# Patient Record
Sex: Male | Born: 1960 | Race: White | Hispanic: No | Marital: Married | Smoking: Current every day smoker
Health system: Southern US, Community
[De-identification: ages and names within clinical notes are randomized; demographics above are authoritative.]

## PROBLEM LIST (undated history)

## (undated) DIAGNOSIS — Z22322 Carrier or suspected carrier of Methicillin resistant Staphylococcus aureus: Secondary | ICD-10-CM

## (undated) DIAGNOSIS — E119 Type 2 diabetes mellitus without complications: Secondary | ICD-10-CM

## (undated) DIAGNOSIS — G629 Polyneuropathy, unspecified: Secondary | ICD-10-CM

## (undated) DIAGNOSIS — Z86711 Personal history of pulmonary embolism: Secondary | ICD-10-CM

## (undated) DIAGNOSIS — Z973 Presence of spectacles and contact lenses: Secondary | ICD-10-CM

## (undated) DIAGNOSIS — G4730 Sleep apnea, unspecified: Secondary | ICD-10-CM

## (undated) DIAGNOSIS — N433 Hydrocele, unspecified: Secondary | ICD-10-CM

## (undated) DIAGNOSIS — J410 Simple chronic bronchitis: Secondary | ICD-10-CM

## (undated) DIAGNOSIS — I10 Essential (primary) hypertension: Secondary | ICD-10-CM

## (undated) DIAGNOSIS — J439 Emphysema, unspecified: Secondary | ICD-10-CM

## (undated) DIAGNOSIS — R911 Solitary pulmonary nodule: Secondary | ICD-10-CM

## (undated) DIAGNOSIS — Z7901 Long term (current) use of anticoagulants: Secondary | ICD-10-CM

## (undated) DIAGNOSIS — Z8614 Personal history of Methicillin resistant Staphylococcus aureus infection: Secondary | ICD-10-CM

## (undated) DIAGNOSIS — J449 Chronic obstructive pulmonary disease, unspecified: Secondary | ICD-10-CM

## (undated) DIAGNOSIS — G4734 Idiopathic sleep related nonobstructive alveolar hypoventilation: Secondary | ICD-10-CM

## (undated) DIAGNOSIS — I2699 Other pulmonary embolism without acute cor pulmonale: Secondary | ICD-10-CM

## (undated) DIAGNOSIS — Z86718 Personal history of other venous thrombosis and embolism: Secondary | ICD-10-CM

## (undated) DIAGNOSIS — K219 Gastro-esophageal reflux disease without esophagitis: Secondary | ICD-10-CM

## (undated) DIAGNOSIS — R351 Nocturia: Secondary | ICD-10-CM

## (undated) DIAGNOSIS — D689 Coagulation defect, unspecified: Secondary | ICD-10-CM

## (undated) HISTORY — DX: Coagulation defect, unspecified: D68.9

## (undated) HISTORY — DX: Emphysema, unspecified: J43.9

## (undated) HISTORY — DX: Carrier or suspected carrier of methicillin resistant Staphylococcus aureus: Z22.322

## (undated) HISTORY — PX: INCISION AND DRAINAGE ABSCESS: SHX5864

## (undated) HISTORY — DX: Sleep apnea, unspecified: G47.30

## (undated) HISTORY — DX: Essential (primary) hypertension: I10

## (undated) HISTORY — DX: Chronic obstructive pulmonary disease, unspecified: J44.9

## (undated) HISTORY — DX: Gastro-esophageal reflux disease without esophagitis: K21.9

## (undated) HISTORY — DX: Other pulmonary embolism without acute cor pulmonale: I26.99

## (undated) HISTORY — DX: Solitary pulmonary nodule: R91.1

## (undated) HISTORY — DX: Type 2 diabetes mellitus without complications: E11.9

## (undated) HISTORY — DX: Polyneuropathy, unspecified: G62.9

---

## 2003-06-15 ENCOUNTER — Emergency Department (HOSPITAL_COMMUNITY): Admission: EM | Admit: 2003-06-15 | Discharge: 2003-06-15 | Payer: Self-pay | Admitting: *Deleted

## 2003-06-15 ENCOUNTER — Encounter: Payer: Self-pay | Admitting: *Deleted

## 2005-11-30 ENCOUNTER — Ambulatory Visit: Payer: Self-pay | Admitting: Internal Medicine

## 2008-06-25 DIAGNOSIS — J111 Influenza due to unidentified influenza virus with other respiratory manifestations: Secondary | ICD-10-CM | POA: Insufficient documentation

## 2008-06-28 ENCOUNTER — Ambulatory Visit: Payer: Self-pay | Admitting: Family Medicine

## 2008-06-28 DIAGNOSIS — J45909 Unspecified asthma, uncomplicated: Secondary | ICD-10-CM | POA: Insufficient documentation

## 2008-09-09 ENCOUNTER — Ambulatory Visit: Payer: Self-pay | Admitting: Family Medicine

## 2009-08-24 ENCOUNTER — Inpatient Hospital Stay (HOSPITAL_COMMUNITY): Admission: EM | Admit: 2009-08-24 | Discharge: 2009-08-25 | Payer: Self-pay | Admitting: Emergency Medicine

## 2009-08-25 ENCOUNTER — Telehealth: Payer: Self-pay | Admitting: Family Medicine

## 2009-08-29 ENCOUNTER — Ambulatory Visit: Payer: Self-pay | Admitting: Internal Medicine

## 2009-08-29 DIAGNOSIS — Z86711 Personal history of pulmonary embolism: Secondary | ICD-10-CM | POA: Insufficient documentation

## 2009-08-29 DIAGNOSIS — I2699 Other pulmonary embolism without acute cor pulmonale: Secondary | ICD-10-CM

## 2009-08-29 DIAGNOSIS — I82409 Acute embolism and thrombosis of unspecified deep veins of unspecified lower extremity: Secondary | ICD-10-CM | POA: Insufficient documentation

## 2009-08-29 LAB — CONVERTED CEMR LAB
INR: 1.7
Prothrombin Time: 16.1 s

## 2009-09-01 ENCOUNTER — Ambulatory Visit: Payer: Self-pay | Admitting: Internal Medicine

## 2009-09-01 LAB — CONVERTED CEMR LAB
INR: 2.6
Prothrombin Time: 19.6 s

## 2009-09-02 ENCOUNTER — Encounter: Payer: Self-pay | Admitting: Internal Medicine

## 2009-09-05 ENCOUNTER — Telehealth: Payer: Self-pay | Admitting: Internal Medicine

## 2009-09-05 ENCOUNTER — Ambulatory Visit: Payer: Self-pay | Admitting: Internal Medicine

## 2009-09-05 LAB — CONVERTED CEMR LAB
INR: 2.4
Prothrombin Time: 18.7 s

## 2009-09-09 ENCOUNTER — Ambulatory Visit: Payer: Self-pay | Admitting: Internal Medicine

## 2009-09-09 DIAGNOSIS — J984 Other disorders of lung: Secondary | ICD-10-CM | POA: Insufficient documentation

## 2009-09-09 DIAGNOSIS — R911 Solitary pulmonary nodule: Secondary | ICD-10-CM | POA: Insufficient documentation

## 2009-09-09 LAB — CONVERTED CEMR LAB
INR: 2.1
Prothrombin Time: 17.8 s

## 2009-09-23 ENCOUNTER — Ambulatory Visit: Payer: Self-pay | Admitting: Internal Medicine

## 2009-09-23 LAB — CONVERTED CEMR LAB
INR: 1.6
Prothrombin Time: 15.6 s

## 2009-10-06 ENCOUNTER — Ambulatory Visit: Payer: Self-pay | Admitting: Internal Medicine

## 2009-10-06 LAB — CONVERTED CEMR LAB
INR: 1.6
Prothrombin Time: 15.5 s

## 2009-10-27 ENCOUNTER — Ambulatory Visit: Payer: Self-pay | Admitting: Internal Medicine

## 2009-10-27 LAB — CONVERTED CEMR LAB
INR: 1.9
Prothrombin Time: 17 s

## 2009-11-24 ENCOUNTER — Ambulatory Visit: Payer: Self-pay | Admitting: Internal Medicine

## 2009-11-24 LAB — CONVERTED CEMR LAB
INR: 1.2
Prothrombin Time: 13.5 s

## 2009-12-08 ENCOUNTER — Ambulatory Visit: Payer: Self-pay | Admitting: Internal Medicine

## 2009-12-08 LAB — CONVERTED CEMR LAB
INR: 1.5
Prothrombin Time: 15.2 s

## 2009-12-22 ENCOUNTER — Ambulatory Visit: Payer: Self-pay | Admitting: Internal Medicine

## 2009-12-22 LAB — CONVERTED CEMR LAB
INR: 1.9
Prothrombin Time: 17.1 s

## 2010-01-05 ENCOUNTER — Ambulatory Visit: Payer: Self-pay | Admitting: Internal Medicine

## 2010-01-05 LAB — CONVERTED CEMR LAB
INR: 3.1
Prothrombin Time: 21.3 s

## 2010-02-21 ENCOUNTER — Ambulatory Visit: Payer: Self-pay | Admitting: Internal Medicine

## 2010-02-21 DIAGNOSIS — G4700 Insomnia, unspecified: Secondary | ICD-10-CM | POA: Insufficient documentation

## 2010-04-10 ENCOUNTER — Telehealth: Payer: Self-pay | Admitting: Internal Medicine

## 2010-10-03 NOTE — Assessment & Plan Note (Signed)
Summary: PT //RS  Nurse Visit   Allergies: No Known Drug Allergies Laboratory Results   Blood Tests   Date/Time Received: December 08, 2009 3:59 PM  Date/Time Reported: December 08, 2009 3:59 PM   PT: 15.2 s   (Normal Range: 10.6-13.4)  INR: 1.5   (Normal Range: 0.88-1.12   Therap INR: 2.0-3.5) Comments: Wynona Canes, CMA  December 08, 2009 3:59 PM     Orders Added: 1)  Est. Patient Level I [99211] 2)  Protime [25053ZJ] Prescriptions: WARFARIN SODIUM 5 MG TABS (WARFARIN SODIUM) two daily  #90 x 0   Entered by:   Sid Falcon LPN   Authorized by:   Evelena Peat MD   Signed by:   Sid Falcon LPN on 67/34/1937   Method used:   [facility]  [address] [phone]       [address]       [place], [place]  [address]       Ph: [phone]       Fax: [phone]   RxID:   [id number]   Laboratory Results   Blood Tests     PT: 15.2 s   (Normal Range: 10.6-13.4)  INR: 1.5   (Normal Range: 0.88-1.12   Therap INR: 2.0-3.5) Comments: Wynona Canes, CMA  December 08, 2009 3:59 PM       ANTICOAGULATION RECORD PREVIOUS REGIMEN & LAB RESULTS Anticoagulation Diagnosis:  v58.61,v58.83,453.41,415.19 on  10/27/2009 Previous INR Goal Range:  2.0-3.0 on  10/27/2009 Previous INR:  1.2 on  11/24/2009 Previous Coumadin Dose(mg):  12.5 mg tues & thurs, 10 mg others on  11/24/2009 Previous Regimen:  12.5 mg M, W, F, 10 mg others on  11/24/2009  NEW REGIMEN & LAB RESULTS Current INR: 1.5 Current Coumadin Dose(mg): 12.5mg  M,W,F 10mg  others Regimen: 12.5mg  on m,w.f,sat then 10mg  other days       Repeat testing in: 2 weeks MEDICATIONS WARFARIN SODIUM 5 MG TABS (WARFARIN SODIUM) two daily HYDROCODONE-ACETAMINOPHEN 5-500 MG TABS (HYDROCODONE-ACETAMINOPHEN) one every 6 hours as needed for pain   Anticoagulation Visit Questionnaire      Coumadin dose missed/changed:  No      Abnormal Bleeding Symptoms:  No   Any diet  changes including alcohol intake, vegetables or greens since the last visit:  No Any illnesses or hospitalizations since the last visit:  No Any signs of clotting since the last visit (including chest discomfort, dizziness, shortness of breath, arm tingling, slurred speech, swelling or redness in leg):  No

## 2010-10-03 NOTE — Letter (Signed)
Summary: Generic Letter  [place] [facility]  [address] [place], [place] [address]   Phone: [phone]  Fax: [phone]    09/09/2009  Dustin Koch [address] [place], [place]  [address]  Dear Milford Cage:  [name] is a patient followed in our internal medicine practice and has been hospitalized recently for thromboembolic disease.  He remains on chronic anticoagulation therapy;  it is recommended that he avoid strenuous activity,  heavy lifting and prolonged walking on hard surfaces.  Frequent periods of rest with elevation of his legs is strongly encouraged.  This period of  restricted activity is anticipated to last approximately 4 weeks.          Sincerely,   Eleonore Chiquito  MD

## 2010-10-03 NOTE — Assessment & Plan Note (Signed)
Summary: PT//LH  Nurse Visit   Allergies: No Known Drug Allergies Laboratory Results   Blood Tests     PT: 15.5 s   (Normal Range: 10.6-13.4)  INR: 1.6   (Normal Range: 0.88-1.12   Therap INR: 2.0-3.5) Comments: Rita Ohara  October 06, 2009 3:55 PM     Orders Added: 1)  Est. Patient Level I [99211] 2)  Protime [[id number]]   ANTICOAGULATION RECORD PREVIOUS REGIMEN & LAB RESULTS   Previous INR:  1.6 on  09/23/2009  Previous Regimen:  sme on  09/23/2009  NEW REGIMEN & LAB RESULTS Current INR: 1.6 Regimen: 12.5mg . M, TH all others 10mg .  Repeat testing in: 3 weeks  Anticoagulation Visit Questionnaire Coumadin dose missed/changed:  No Abnormal Bleeding Symptoms:  No  Any diet changes including alcohol intake, vegetables or greens since the last visit:  No Any illnesses or hospitalizations since the last visit:  No Any signs of clotting since the last visit (including chest discomfort, dizziness, shortness of breath, arm tingling, slurred speech, swelling or redness in leg):  No  MEDICATIONS WARFARIN SODIUM 5 MG TABS (WARFARIN SODIUM) two daily HYDROCODONE-ACETAMINOPHEN 5-500 MG TABS (HYDROCODONE-ACETAMINOPHEN) one every 6 hours as needed for pain

## 2010-10-03 NOTE — Assessment & Plan Note (Signed)
Summary: PT//CCM  Nurse Visit   Allergies: No Known Drug Allergies Laboratory Results   Blood Tests   Date/Time Received: November 24, 2009 3:31 PM Date/Time Reported: November 24, 2009 3:31 PM  PT: 13.5 s   (Normal Range: 10.6-13.4)  INR: 1.2   (Normal Range: 0.88-1.12   Therap INR: 2.0-3.5) Comments: Wynona Canes, CMA  November 24, 2009 3:32 PM    Orders Added: 1)  Est. Patient Level I [99211] 2)  Protime [[id number]]  Laboratory Results   Blood Tests     PT: 13.5 s   (Normal Range: 10.6-13.4)  INR: 1.2   (Normal Range: 0.88-1.12   Therap INR: 2.0-3.5) Comments: Wynona Canes, CMA  November 24, 2009 3:32 PM      ANTICOAGULATION RECORD PREVIOUS REGIMEN & LAB RESULTS Anticoagulation Diagnosis:  v58.61,v58.83,453.41,415.19 on  10/27/2009 Previous INR Goal Range:  2.0-3.0 on  10/27/2009 Previous INR:  1.9 on  10/27/2009 Previous Coumadin Dose(mg):  12.5mg  on mon,thu. 10mg  other days on  10/27/2009 Previous Regimen:  same on  10/27/2009  NEW REGIMEN & LAB RESULTS Current INR: 1.2 Current Coumadin Dose(mg): 12.5 mg tues & thurs, 10 mg others Regimen: 12.5 mg M, W, F, 10 mg others       Repeat testing in: 2 wks MEDICATIONS WARFARIN SODIUM 5 MG TABS (WARFARIN SODIUM) two daily HYDROCODONE-ACETAMINOPHEN 5-500 MG TABS (HYDROCODONE-ACETAMINOPHEN) one every 6 hours as needed for pain   Anticoagulation Visit Questionnaire      Coumadin dose missed/changed:  No      Abnormal Bleeding Symptoms:  No   Any diet changes including alcohol intake, vegetables or greens since the last visit:  No Any illnesses or hospitalizations since the last visit:  No Any signs of clotting since the last visit (including chest discomfort, dizziness, shortness of breath, arm tingling, slurred speech, swelling or redness in leg):  No

## 2010-10-03 NOTE — Progress Notes (Signed)
Summary: Pt req refill of Triazolam  Phone Note Call from Patient Call back at Home Phone [phone]   Caller: Patient Call For: Dustin Savers  MD Summary of Call: pt needs new rx triazolam [facility] [place] pt will cb with number Initial call taken by: Heron Sabins,  April 10, 2010 9:29 AM  Follow-up for Phone Call        Pt called back with number for [facility] [phone].   Follow-up by: Lucy Antigua,  April 10, 2010 9:41 AM  Additional Follow-up for Phone Call Additional follow up Details #1::        Pt called back again to check on status of this refill. Please have a doctor call this in today. Pt has now been out of this med for at least a week. Pt doesn't take this med every night, just prn.  Pt has now gone home to [place], [place] [facility] [place] [address] [phone]. Additional Follow-up by: Lucy Antigua,  April 14, 2010 2:59 PM    Prescriptions: TRIAZOLAM 0.25 MG TABS (TRIAZOLAM) one tablet at bedtime as needed  #30 x 0   Entered by:   Duard Brady LPN   Authorized by:   Dustin Savers  MD   Signed by:   Duard Brady LPN on 57/84/6962   Method used:   Historical   RxID:   [id number]  [facility] [place]. KIK

## 2010-10-03 NOTE — Assessment & Plan Note (Signed)
Summary: pt/ccm also ?bronchitis/njr   Vital Signs:  Patient profile:   50 year old male Weight:      231 pounds Temp:     98.0 degrees F oral BP sitting:   110 / 80  (right arm) Cuff size:   regular  Vitals Entered By: Duard Brady LPN (Jan 05, 453 3:56 PM) CC: c/o cough congestion fever  Is Patient Diabetic? No   CC:  c/o cough congestion fever .  Preventive Screening-Counseling & Management  Alcohol-Tobacco     Smoking Status: current     Smoking Cessation Counseling: yes  Allergies (verified): No Known Drug Allergies  Past History:  Past Medical History: Reviewed history from 09/09/2009 and no changes required. Asthma tobacco abuse acute pulmonary embolism 12- 2010 history of right middle lobe pulmonary nodule ( follow up chest CT recommended in June 2011)   Complete Medication List: 1)  Warfarin Sodium 5 Mg Tabs (Warfarin sodium) .... Two daily 2)  Hydrocodone-acetaminophen 5-500 Mg Tabs (Hydrocodone-acetaminophen) .... One every 6 hours as needed for pain 3)  Avelox 400 Mg Tabs (Moxifloxacin hcl) .... One daily 4)  Chantix Starting Month Pak 0.5 Mg X 11 & 1 Mg X 42 Tabs (Varenicline tartrate) .... As directed 5)  Chantix 1 Mg Tabs (Varenicline tartrate) .... One twice daily  Other Orders: Protime ([id number]) Fingerstick [phone])  Patient Instructions: 1)  Tobacco is very bad for your health and your loved ones! You Should stop smoking!. 2)  Get plenty of rest, drink lots of clear liquids, and use Tylenol  for fever and comfort. Return in 7-10 days if you're not better:sooner if you're feeling worse. 3)  Take your antibiotic as prescribed until ALL of it is gone, but stop if you develop a rash or swelling and contact our office as soon as possible. 4)  follow up Chest CT SCAN IN ONE MONTH 5)  STOP COUMADIN IN ONE MONTH Prescriptions: HYDROCODONE-ACETAMINOPHEN 5-500 MG TABS (HYDROCODONE-ACETAMINOPHEN) one every 6 hours as needed for pain  #50 x 2  Entered and Authorized by:   Gordy Savers  MD   Signed by:   Gordy Savers  MD on 01/05/2010   Method used:   Print then Give to Patient   RxID:   [id number] CHANTIX 1 MG TABS (VARENICLINE TARTRATE) one twice daily  #60 x 2   Entered and Authorized by:   Gordy Savers  MD   Signed by:   Gordy Savers  MD on 01/05/2010   Method used:   Print then Give to Patient   RxID:   [id number] CHANTIX STARTING MONTH PAK 0.5 MG X 11 & 1 MG X 42 TABS (VARENICLINE TARTRATE) as directed  #1 x 0   Entered and Authorized by:   Gordy Savers  MD   Signed by:   Gordy Savers  MD on 01/05/2010   Method used:   Print then Give to Patient   RxID:   [id number]   Laboratory Results   Blood Tests   Date/Time Recieved: Jan 05, 2010 3:54 PM  Date/Time Reported: Jan 05, 2010 3:54 PM   PT: 21.3 s   (Normal Range: 10.6-13.4)  INR: 3.1   (Normal Range: 0.88-1.12   Therap INR: 2.0-3.5) Comments: Wynona Canes, CMA  Jan 05, 2010 3:54 PM       ANTICOAGULATION RECORD PREVIOUS REGIMEN & LAB RESULTS Anticoagulation Diagnosis:  v58.61,v58.83,453.41,415.19 on  10/27/2009 Previous INR Goal Range:  2.0-3.0  on  10/27/2009 Previous INR:  1.9 on  12/22/2009 Previous Coumadin Dose(mg):  12.5 mg m, w, f, sat, 10mg  others on  12/22/2009 Previous Regimen:  10 mg m & th, 12.5 others on  12/22/2009  NEW REGIMEN & LAB RESULTS Current INR: 3.1 Current Coumadin Dose(mg): 10mg  on f,s,s then 12.5mg  on other days Regimen: 10 mg m & th, 12.5 others  (no change)       Repeat testing in: 2 weeks MEDICATIONS WARFARIN SODIUM 5 MG TABS (WARFARIN SODIUM) two daily HYDROCODONE-ACETAMINOPHEN 5-500 MG TABS (HYDROCODONE-ACETAMINOPHEN) one every 6 hours as needed for pain AVELOX 400 MG TABS (MOXIFLOXACIN HCL) one daily CHANTIX STARTING MONTH PAK 0.5 MG X 11 & 1 MG X 42 TABS (VARENICLINE TARTRATE) as directed CHANTIX 1 MG TABS (VARENICLINE TARTRATE) one twice  daily   Anticoagulation Visit Questionnaire      Coumadin dose missed/changed:  No      Abnormal Bleeding Symptoms:  No   Any diet changes including alcohol intake, vegetables or greens since the last visit:  No Any illnesses or hospitalizations since the last visit:  No Any signs of clotting since the last visit (including chest discomfort, dizziness, shortness of breath, arm tingling, slurred speech, swelling or redness in leg):  No

## 2010-10-03 NOTE — Progress Notes (Signed)
Summary: coumadin check needed 09/05/09  Phone Note Other Incoming   Summary of Call: Pt. called and stated will RTO today, 09/05/09, in the afternoon for Coumadin check. Joanne Chars CMA  September 05, 2009 8:16 AM  Summary of Call: [facility] fot pt. RTO for Coumadin check today, 09/05/09 per Dr. Kirtland Bouchard. Joanne Chars CMA  September 05, 2009 7:56 AM

## 2010-10-03 NOTE — Assessment & Plan Note (Signed)
Summary: talk about coumadin//lh//pt   Vital Signs:  Patient profile:   50 year old male Weight:      227 pounds O2 Sat:      97 % on Room air Temp:     98.4 degrees F oral BP sitting:   140 / 88  (left arm) Cuff size:   regular  Vitals Entered By: Raechel Ache, RN (September 09, 2009 11:44 AM)  O2 Flow:  Room air CC: Hosp f/u; feels weak and L leg still sore.   CC:  Hosp f/u; feels weak and L leg still sore.Marland Kitchen  History of Present Illness: 50 year old patient who is seen today posthospital discharge for acute left lower extremity DVT with acute pulmonary embolism.  He was discharged on Coumadin 10 mg daily, and serial INRs have been therapeutic on this dose.  He has no primary complaint.  He has returned to work and yesterday had considerable left leg discomfort.  He denies any chest pain or shortness of breath.  He takes no other medications. Hospital records were reviewed.  There were some concern about a right middle lobe nodule and follow-up CT in 6 months was recommended  Allergies: No Known Drug Allergies  Past History:  Past Medical History: Asthma tobacco abuse acute pulmonary embolism 12- 2010 history of right middle lobe pulmonary nodule ( follow up chest CT recommended in June 2011)  Social History: Reviewed history from 06/28/2008 and no changes required. Occupation:  Holiday representative work Married Current Smoker Alcohol use-no Drug use-no Regular exercise-yes  Review of Systems       The patient complains of difficulty walking.  The patient denies anorexia, fever, weight loss, weight gain, vision loss, decreased hearing, hoarseness, chest pain, syncope, dyspnea on exertion, peripheral edema, prolonged cough, headaches, hemoptysis, abdominal pain, melena, hematochezia, severe indigestion/heartburn, hematuria, incontinence, genital sores, muscle weakness, suspicious skin lesions, transient blindness, depression, unusual weight change, abnormal bleeding, enlarged  lymph nodes, angioedema, breast masses, and testicular masses.    Physical Exam  General:  Well-developed,well-nourished,in no acute distress; alert,appropriate and cooperative throughout examination Head:  Normocephalic and atraumatic without obvious abnormalities. No apparent alopecia or balding. Eyes:  No corneal or conjunctival inflammation noted. EOMI. Perrla. Funduscopic exam benign, without hemorrhages, exudates or papilledema. Vision grossly normal. Mouth:  Oral mucosa and oropharynx without lesions or exudates.  Teeth in good repair. Neck:  No deformities, masses, or tenderness noted. Lungs:  Normal respiratory effort, chest expands symmetrically. Lungs are clear to auscultation, no crackles or wheezes.  O2 saturation 97% Heart:  Normal rate and regular rhythm. S1 and S2 normal without gallop, murmur, click, rub or other extra sounds. Msk:  minimal tenderness left calf.  No edema   Impression & Recommendations:  Problem # 1:  PULMONARY EMBOLISM (ICD-415.19)  His updated medication list for this problem includes:    Warfarin Sodium 5 Mg Tabs (Warfarin sodium) .Marland Kitchen..Marland Kitchen Two daily  Orders: Protime (03474QV)  His updated medication list for this problem includes:    Warfarin Sodium 5 Mg Tabs (Warfarin sodium) .Marland Kitchen..Marland Kitchen Two daily  Problem # 2:  COUMADIN THERAPY (ICD-V58.61)  Orders: Protime ([id number])  Problem # 3:  DVT (ICD-453.40)  Orders: Protime ([id number]) a letter on his behalf, was dictated recommending limited duty over the next month  Problem # 4:  ASTHMA (ICD-493.90)  Problem # 5:  PULMONARY NODULE, RIGHT MIDDLE LOBE (ICD-518.89) follow-up chest CT in June 2011  Complete Medication List: 1)  Warfarin Sodium 5 Mg Tabs (Warfarin sodium) .Marland KitchenMarland KitchenMarland Kitchen  Two daily 2)  Hydrocodone-acetaminophen 5-500 Mg Tabs (Hydrocodone-acetaminophen) .... One every 6 hours as needed for pain  Patient Instructions: 1)  Please schedule a follow-up appointment in 3 months. 2)  Limit your Sodium  (Salt). 3)  PT/INR in 2 weeks (ICD 415.19) Prescriptions: HYDROCODONE-ACETAMINOPHEN 5-500 MG TABS (HYDROCODONE-ACETAMINOPHEN) one every 6 hours as needed for pain  #50 x 0   Entered and Authorized by:   Gordy Savers  MD   Signed by:   Gordy Savers  MD on 09/09/2009   Method used:   Print then Give to Patient   RxID:   [id number] WARFARIN SODIUM 5 MG TABS (WARFARIN SODIUM) two daily  #50 x 0   Entered and Authorized by:   Gordy Savers  MD   Signed by:   Gordy Savers  MD on 09/09/2009   Method used:   Print then Give to Patient   RxID:   [id number]    ANTICOAGULATION RECORD PREVIOUS REGIMEN & LAB RESULTS   Previous INR:  2.4 on  09/05/2009  Previous Regimen:  same on  09/05/2009  NEW REGIMEN & LAB RESULTS Current INR: 2.1 Regimen: same  (no change)   Anticoagulation Visit Questionnaire Coumadin dose missed/changed:  No Abnormal Bleeding Symptoms:  No  Any diet changes including alcohol intake, vegetables or greens since the last visit:  No Any illnesses or hospitalizations since the last visit:  No Any signs of clotting since the last visit (including chest discomfort, dizziness, shortness of breath, arm tingling, slurred speech, swelling or redness in leg):  Yes      Signs of Clotting:  "working and walking on concrete all day"..."legs hurting"  MEDICATIONS WARFARIN SODIUM 5 MG TABS (WARFARIN SODIUM) two daily HYDROCODONE-ACETAMINOPHEN 5-500 MG TABS (HYDROCODONE-ACETAMINOPHEN) one every 6 hours as needed for pain    Laboratory Results   Blood Tests     PT: 17.8 s   (Normal Range: 10.6-13.4)  INR: 2.1   (Normal Range: 0.88-1.12   Therap INR: 2.0-3.5) Comments: Joanne Chars CMA  September 09, 2009 11:23 AM

## 2010-10-03 NOTE — Assessment & Plan Note (Signed)
Summary: pt/njr  Nurse Visit   Allergies: No Known Drug Allergies Laboratory Results   Blood Tests   Date/Time Received: December 22, 2009 3:21 PM Date/Time Reported: December 22, 2009 3:21 PM  PT: 17.1 s   (Normal Range: 10.6-13.4)  INR: 1.9   (Normal Range: 0.88-1.12   Therap INR: 2.0-3.5) Comments: Wynona Canes, CMA  December 22, 2009 3:21 PM    Orders Added: 1)  Est. Patient Level I [99211] 2)  Protime [[id number]]   ANTICOAGULATION RECORD PREVIOUS REGIMEN & LAB RESULTS Anticoagulation Diagnosis:  v58.61,v58.83,453.41,415.19 on  10/27/2009 Previous INR Goal Range:  2.0-3.0 on  10/27/2009 Previous INR:  1.5 on  12/08/2009 Previous Coumadin Dose(mg):  12.5mg  M,W,F 10mg  others on  12/08/2009 Previous Regimen:  12.5mg  on m,w.f,sat then 10mg  other days on  12/08/2009  NEW REGIMEN & LAB RESULTS Current INR: 1.9 Current Coumadin Dose(mg): 12.5 mg m, w, f, sat, 10mg  others Regimen: 10 mg m & th, 12.5 others       Repeat testing in: 2wks MEDICATIONS WARFARIN SODIUM 5 MG TABS (WARFARIN SODIUM) two daily HYDROCODONE-ACETAMINOPHEN 5-500 MG TABS (HYDROCODONE-ACETAMINOPHEN) one every 6 hours as needed for pain   Anticoagulation Visit Questionnaire      Coumadin dose missed/changed:  No      Abnormal Bleeding Symptoms:  No   Any diet changes including alcohol intake, vegetables or greens since the last visit:  No Any illnesses or hospitalizations since the last visit:  No Any signs of clotting since the last visit (including chest discomfort, dizziness, shortness of breath, arm tingling, slurred speech, swelling or redness in leg):  No   Laboratory Results   Blood Tests     PT: 17.1 s   (Normal Range: 10.6-13.4)  INR: 1.9   (Normal Range: 0.88-1.12   Therap INR: 2.0-3.5) Comments: Wynona Canes, CMA  December 22, 2009 3:21 PM

## 2010-10-03 NOTE — Assessment & Plan Note (Signed)
Summary: HTN CONCERNS // RS   Vital Signs:  Patient profile:   50 year old male Weight:      233 pounds Temp:     98.5 degrees F oral BP sitting:   140 / 80  (left arm) Cuff size:   regular  Vitals Entered By: Duard Brady LPN (February 21, 2010 4:22 PM) CC: concerns about elevated BP with home machine Is Patient Diabetic? No   CC:  concerns about elevated BP with home machine.  History of Present Illness: 50 year old patient who for the past few  weeks has had fatigue, poor sleep habits and the labile blood pressure readings.  He is unclear why he is sleeping, so poorly that he gets very poor quality sleep.  His stress level is probably at baseline.  He often wakes with headaches and has daily, fatigue.  Blood pressures are often quite high in stage II, range, but often also normal.  Six weeks ago.  His blood pressure here was in a low normal range  Preventive Screening-Counseling & Management  Alcohol-Tobacco     Smoking Status: current  Allergies (verified): No Known Drug Allergies  Physical Exam  General:  Well-developed,well-nourished,in no acute distress; alert,appropriate and cooperative throughout examination; for pressure 140/80 Head:  Normocephalic and atraumatic without obvious abnormalities. No apparent alopecia or balding. Mouth:  Oral mucosa and oropharynx without lesions or exudates.  Teeth in good repair. Neck:  No deformities, masses, or tenderness noted. Lungs:  Normal respiratory effort, chest expands symmetrically. Lungs are clear to auscultation, no crackles or wheezes. Heart:  Normal rate and regular rhythm. S1 and S2 normal without gallop, murmur, click, rub or other extra sounds.   Impression & Recommendations:  Problem # 1:  PULMONARY NODULE, RIGHT MIDDLE LOBE (ICD-518.89) will need follow-up chest CT next month  Problem # 2:  PULMONARY EMBOLISM (ICD-415.19)  The following medications were removed from the medication list:    Warfarin Sodium 5  Mg Tabs (Warfarin sodium) .Marland Kitchen..Marland Kitchen Two daily  Problem # 3:  INSOMNIA (ICD-780.52)  His updated medication list for this problem includes:    Triazolam 0.25 Mg Tabs (Triazolam) ..... One tablet at bedtime as needed  Complete Medication List: 1)  Hydrocodone-acetaminophen 5-500 Mg Tabs (Hydrocodone-acetaminophen) .... One every 6 hours as needed for pain 2)  Triazolam 0.25 Mg Tabs (Triazolam) .... One tablet at bedtime as needed  Patient Instructions: 1)  Please schedule a follow-up appointment in 1 month. 2)  Check your Blood Pressure regularly.  Prescriptions: TRIAZOLAM 0.25 MG TABS (TRIAZOLAM) one tablet at bedtime as needed  #30 x 0   Entered and Authorized by:   Gordy Savers  MD   Signed by:   Gordy Savers  MD on 02/21/2010   Method used:   Print then Give to Patient   RxID:   [id number]

## 2010-10-03 NOTE — Assessment & Plan Note (Signed)
Summary: PT/RCD  Nurse Visit   Allergies: No Known Drug Allergies Laboratory Results   Blood Tests     PT: 18.7 s   (Normal Range: 10.6-13.4)  INR: 2.4   (Normal Range: 0.88-1.12   Therap INR: 2.0-3.5) Comments: Rita Ohara  September 05, 2009 1:13 PM     Orders Added: 1)  Est. Patient Level I [99211] 2)  Protime [[id number]]   ANTICOAGULATION RECORD PREVIOUS REGIMEN & LAB RESULTS   Previous INR:  2.6 on  09/01/2009  Previous Regimen:  RTO 3 days, continue 10mg  everyday. on  08/29/2009  NEW REGIMEN & LAB RESULTS Current INR: 2.4 Regimen: same  Repeat testing in: Make office visit for this Fri.  Anticoagulation Visit Questionnaire Coumadin dose missed/changed:  No Abnormal Bleeding Symptoms:  No  Any diet changes including alcohol intake, vegetables or greens since the last visit:  No Any illnesses or hospitalizations since the last visit:  No Any signs of clotting since the last visit (including chest discomfort, dizziness, shortness of breath, arm tingling, slurred speech, swelling or redness in leg):  No

## 2010-10-03 NOTE — Assessment & Plan Note (Signed)
Summary: pt//ccm  Nurse Visit   Allergies: No Known Drug Allergies Laboratory Results   Blood Tests   Date/Time Received: October 27, 2009 4:12 PM  Date/Time Reported: October 27, 2009 4:12 PM   PT: 17.0 s   (Normal Range: 10.6-13.4)  INR: 1.9   (Normal Range: 0.88-1.12   Therap INR: 2.0-3.5) Comments: Wynona Canes, CMA  October 27, 2009 4:12 PM     Orders Added: 1)  Est. Patient Level I [99211] 2)  Protime [78295AO]  Laboratory Results   Blood Tests     PT: 17.0 s   (Normal Range: 10.6-13.4)  INR: 1.9   (Normal Range: 0.88-1.12   Therap INR: 2.0-3.5) Comments: Wynona Canes, CMA  October 27, 2009 4:12 PM       ANTICOAGULATION RECORD PREVIOUS REGIMEN & LAB RESULTS   Previous INR:  1.6 on  10/06/2009  Previous Regimen:  12.5mg . M, TH all others 10mg . on  10/06/2009  NEW REGIMEN & LAB RESULTS Anticoag. Dx: v58.61,v58.83,453.41,415.19 Current INR Goal Range: 2.0-3.0 Current INR: 1.9 Current Coumadin Dose(mg): 12.5mg  on mon,thu. 10mg  other days Regimen: same       Repeat testing in: 4 weeks MEDICATIONS WARFARIN SODIUM 5 MG TABS (WARFARIN SODIUM) two daily HYDROCODONE-ACETAMINOPHEN 5-500 MG TABS (HYDROCODONE-ACETAMINOPHEN) one every 6 hours as needed for pain   Anticoagulation Visit Questionnaire      Coumadin dose missed/changed:  No      Abnormal Bleeding Symptoms:  No   Any diet changes including alcohol intake, vegetables or greens since the last visit:  No Any illnesses or hospitalizations since the last visit:  No Any signs of clotting since the last visit (including chest discomfort, dizziness, shortness of breath, arm tingling, slurred speech, swelling or redness in leg):  No

## 2010-10-03 NOTE — Letter (Signed)
Summary: [facility]   Imported By: Maryln Gottron 09/13/2009 12:16:12  _____________________________________________________________________  External Attachment:    Type:   Image     Comment:   External Document

## 2010-12-04 LAB — BASIC METABOLIC PANEL
BUN: 15 mg/dL (ref 6–23)
CO2: 29 mEq/L (ref 19–32)
Calcium: 8.4 mg/dL (ref 8.4–10.5)
Calcium: 8.5 mg/dL (ref 8.4–10.5)
Creatinine, Ser: 1.23 mg/dL (ref 0.4–1.5)
GFR calc Af Amer: >60 mL/min (ref >60)
GFR calc non Af Amer: >60 mL/min (ref >60)
GFR calc non Af Amer: >60 mL/min (ref >60)
Glucose, Bld: 94 mg/dL (ref 70–99)
Sodium: 137 mEq/L (ref 135–145)

## 2010-12-04 LAB — DIFFERENTIAL
Basophils Absolute: 0.1 10*3/uL (ref 0.0–0.1)
Lymphocytes Relative: 20 % (ref 12–46)
Lymphs Abs: 2.1 10*3/uL (ref 0.7–4.0)
Neutrophils Relative %: 69 % (ref 43–77)

## 2010-12-04 LAB — CBC
Hemoglobin: 15.2 g/dL (ref 13.0–17.0)
MCHC: 34 g/dL (ref 30.0–36.0)
Platelets: 195 10*3/uL (ref 150–400)
Platelets: 201 10*3/uL (ref 150–400)
RBC: 4.68 MIL/uL (ref 4.22–5.81)
RDW: 13.7 % (ref 11.5–15.5)
WBC: 10.5 10*3/uL (ref 4.0–10.5)
WBC: 7.3 10*3/uL (ref 4.0–10.5)
WBC: 8.8 10*3/uL (ref 4.0–10.5)

## 2010-12-04 LAB — PROTIME-INR
INR: 0.97 (ref 0.00–1.49)
INR: 1.03 (ref 0.00–1.49)
Prothrombin Time: 12.8 seconds (ref 11.6–15.2)
Prothrombin Time: 12.9 seconds (ref 11.6–15.2)

## 2010-12-04 LAB — CARDIAC PANEL(CRET KIN+CKTOT+MB+TROPI)
CK, MB: 1 ng/mL (ref 0.3–4.0)
CK, MB: 1.1 ng/mL (ref 0.3–4.0)
Relative Index: INVALID (ref 0.0–2.5)
Relative Index: INVALID (ref 0.0–2.5)
Troponin I: 0.02 ng/mL (ref 0.00–0.06)

## 2010-12-04 LAB — LIPID PANEL: HDL: 46 mg/dL (ref >39)

## 2010-12-04 LAB — LUPUS ANTICOAGULANT PANEL
DRVVT: 66 secs — ABNORMAL HIGH (ref 34.7–40.5)
Drvvt confirmation: 1.71 Ratio — ABNORMAL HIGH (ref <1.18)
PTT Lupus Anticoagulant: 111.2 secs — ABNORMAL HIGH (ref 32.0–43.4)
PTTLA Confirmation: 26.2 secs — ABNORMAL HIGH (ref <8.0)

## 2010-12-04 LAB — APTT: aPTT: 71 seconds — ABNORMAL HIGH (ref 24–37)

## 2010-12-04 LAB — TSH: TSH: 3.709 u[IU]/mL (ref 0.350–4.500)

## 2011-05-25 ENCOUNTER — Ambulatory Visit (INDEPENDENT_AMBULATORY_CARE_PROVIDER_SITE_OTHER): Payer: 59 | Admitting: Family Medicine

## 2011-05-25 ENCOUNTER — Encounter: Payer: Self-pay | Admitting: Family Medicine

## 2011-05-25 VITALS — BP 142/90 | HR 102 | Temp 98.7°F | Wt 240.0 lb

## 2011-05-25 DIAGNOSIS — J40 Bronchitis, not specified as acute or chronic: Secondary | ICD-10-CM

## 2011-05-25 MED ORDER — AZITHROMYCIN 250 MG PO TABS: ORAL_TABLET | ORAL | Status: AC

## 2011-05-25 MED ORDER — TRIAZOLAM 0.25 MG PO TABS: 0.2500 mg | ORAL_TABLET | Freq: Every evening | ORAL | Status: DC | PRN

## 2011-05-25 MED ORDER — HYDROCODONE-ACETAMINOPHEN 5-500 MG PO TABS: 1.0000 | ORAL_TABLET | Freq: Four times a day (QID) | ORAL | Status: DC | PRN

## 2011-05-25 NOTE — Progress Notes (Signed)
  Subjective:    Patient ID: Dustin Koch, male    DOB: Jul 04, 1961, 50 y.o.   MRN: [id number]  HPI Here for 4 days of stuffy head, ST, chest congestion, and coughing up yellow sputum. No fever.    Review of Systems  Constitutional: Negative.   HENT: Positive for congestion, postnasal drip and sinus pressure.   Eyes: Negative.   Respiratory: Positive for cough.        Objective:   Physical Exam  Constitutional: He appears well-developed and well-nourished.  HENT:  Right Ear: External ear normal.  Left Ear: External ear normal.  Nose: Nose normal.  Mouth/Throat: Oropharynx is clear and moist. No oropharyngeal exudate.  Eyes: Conjunctivae are normal. Pupils are equal, round, and reactive to light.  Neck: Neck supple. No thyromegaly present.  Pulmonary/Chest: Effort normal. No respiratory distress. He has no wheezes. He has no rales. He exhibits no tenderness.       Scattered rhonchi   Lymphadenopathy:    He has no cervical adenopathy.          Assessment & Plan:  Rest, fluids

## 2013-04-08 DIAGNOSIS — Z720 Tobacco use: Secondary | ICD-10-CM | POA: Insufficient documentation

## 2013-04-08 DIAGNOSIS — J309 Allergic rhinitis, unspecified: Secondary | ICD-10-CM | POA: Insufficient documentation

## 2013-04-08 DIAGNOSIS — J40 Bronchitis, not specified as acute or chronic: Secondary | ICD-10-CM | POA: Insufficient documentation

## 2013-04-08 DIAGNOSIS — R5383 Other fatigue: Secondary | ICD-10-CM | POA: Insufficient documentation

## 2015-03-02 ENCOUNTER — Ambulatory Visit: Payer: Self-pay | Admitting: Medical

## 2015-03-02 DIAGNOSIS — Z0289 Encounter for other administrative examinations: Secondary | ICD-10-CM

## 2015-03-03 ENCOUNTER — Telehealth: Payer: Self-pay | Admitting: Medical

## 2015-03-03 NOTE — Telephone Encounter (Signed)
charge 

## 2015-03-03 NOTE — Telephone Encounter (Signed)
Pt was no show 03/02/15 9:30am, new pt appt, at 9:52am pt was rescheduled for 03/10/15, charge?

## 2015-03-09 ENCOUNTER — Telehealth: Payer: Self-pay | Admitting: Behavioral Health

## 2015-03-09 NOTE — Telephone Encounter (Signed)
Unable to reach patient at time of Pre-Visit Call.  Left message for patient to return call when available.    

## 2015-03-10 ENCOUNTER — Ambulatory Visit (HOSPITAL_BASED_OUTPATIENT_CLINIC_OR_DEPARTMENT_OTHER)
Admission: RE | Admit: 2015-03-10 | Discharge: 2015-03-10 | Disposition: A | Discharge status: Home / Self Care | Payer: 59 | Source: Ambulatory Visit | Attending: Medical | Admitting: Medical

## 2015-03-10 ENCOUNTER — Encounter: Payer: Self-pay | Admitting: Medical

## 2015-03-10 ENCOUNTER — Ambulatory Visit (INDEPENDENT_AMBULATORY_CARE_PROVIDER_SITE_OTHER): Payer: 59 | Admitting: Medical

## 2015-03-10 VITALS — BP 147/103 | HR 79 | Temp 99.0°F | Ht 73.0 in | Wt 265.4 lb

## 2015-03-10 DIAGNOSIS — J449 Chronic obstructive pulmonary disease, unspecified: Secondary | ICD-10-CM

## 2015-03-10 DIAGNOSIS — M7989 Other specified soft tissue disorders: Secondary | ICD-10-CM | POA: Insufficient documentation

## 2015-03-10 DIAGNOSIS — J45909 Unspecified asthma, uncomplicated: Secondary | ICD-10-CM | POA: Diagnosis not present

## 2015-03-10 DIAGNOSIS — I10 Essential (primary) hypertension: Secondary | ICD-10-CM | POA: Diagnosis not present

## 2015-03-10 DIAGNOSIS — K219 Gastro-esophageal reflux disease without esophagitis: Secondary | ICD-10-CM

## 2015-03-10 DIAGNOSIS — M79671 Pain in right foot: Secondary | ICD-10-CM

## 2015-03-10 DIAGNOSIS — R600 Localized edema: Secondary | ICD-10-CM | POA: Insufficient documentation

## 2015-03-10 DIAGNOSIS — J9811 Atelectasis: Secondary | ICD-10-CM | POA: Diagnosis not present

## 2015-03-10 DIAGNOSIS — R0683 Snoring: Secondary | ICD-10-CM | POA: Diagnosis not present

## 2015-03-10 DIAGNOSIS — F17200 Nicotine dependence, unspecified, uncomplicated: Secondary | ICD-10-CM

## 2015-03-10 DIAGNOSIS — Z720 Tobacco use: Secondary | ICD-10-CM

## 2015-03-10 DIAGNOSIS — M79672 Pain in left foot: Secondary | ICD-10-CM

## 2015-03-10 DIAGNOSIS — R05 Cough: Secondary | ICD-10-CM

## 2015-03-10 DIAGNOSIS — B49 Unspecified mycosis: Secondary | ICD-10-CM | POA: Insufficient documentation

## 2015-03-10 DIAGNOSIS — R059 Cough, unspecified: Secondary | ICD-10-CM | POA: Insufficient documentation

## 2015-03-10 LAB — CBC WITH DIFFERENTIAL/PLATELET
Basophils Absolute: 0 10*3/uL (ref 0.0–0.1)
Basophils Relative: 0.4 % (ref 0.0–3.0)
EOS ABS: 0.3 10*3/uL (ref 0.0–0.7)
EOS PCT: 3.4 % (ref 0.0–5.0)
HCT: 46.4 % (ref 39.0–52.0)
Hemoglobin: 15.6 g/dL (ref 13.0–17.0)
LYMPHS PCT: 24.4 % (ref 12.0–46.0)
Lymphs Abs: 2.3 10*3/uL (ref 0.7–4.0)
MCHC: 33.7 g/dL (ref 30.0–36.0)
MCV: 95.6 fl (ref 78.0–100.0)
MONO ABS: 0.7 10*3/uL (ref 0.1–1.0)
Monocytes Relative: 7.6 % (ref 3.0–12.0)
NEUTROS PCT: 64.2 % (ref 43.0–77.0)
Neutro Abs: 6.2 10*3/uL (ref 1.4–7.7)
PLATELETS: 225 10*3/uL (ref 150.0–400.0)
RBC: 4.85 Mil/uL (ref 4.22–5.81)
RDW: 14.1 % (ref 11.5–15.5)
WBC: 9.6 10*3/uL (ref 4.0–10.5)

## 2015-03-10 LAB — COMPREHENSIVE METABOLIC PANEL
ALBUMIN: 3.7 g/dL (ref 3.5–5.2)
ALK PHOS: 81 U/L (ref 39–117)
ALT: 29 U/L (ref 0–53)
AST: 16 U/L (ref 0–37)
BILIRUBIN TOTAL: 0.6 mg/dL (ref 0.2–1.2)
BUN: 17 mg/dL (ref 6–23)
CO2: 29 mEq/L (ref 19–32)
Calcium: 9 mg/dL (ref 8.4–10.5)
Chloride: 104 mEq/L (ref 96–112)
Creatinine, Ser: 1.11 mg/dL (ref 0.40–1.50)
GFR: 73.35 mL/min (ref >60.00)
Glucose, Bld: 106 mg/dL — ABNORMAL HIGH (ref 70–99)
POTASSIUM: 4 meq/L (ref 3.5–5.1)
SODIUM: 138 meq/L (ref 135–145)
TOTAL PROTEIN: 6.8 g/dL (ref 6.0–8.3)

## 2015-03-10 LAB — LIPID PANEL
CHOLESTEROL: 170 mg/dL (ref 0–200)
HDL: 55.6 mg/dL (ref >39.00)
LDL Cholesterol: 105 mg/dL — ABNORMAL HIGH (ref 0–99)
NonHDL: 114.4
Total CHOL/HDL Ratio: 3
Triglycerides: 47 mg/dL (ref 0.0–149.0)
VLDL: 9.4 mg/dL (ref 0.0–40.0)

## 2015-03-10 MED ORDER — FLUTICASONE PROPIONATE 50 MCG/ACT NA SUSP: 2.0000 | Freq: Every day | NASAL | Status: DC

## 2015-03-10 MED ORDER — LISINOPRIL 10 MG PO TABS: 10.0000 mg | ORAL_TABLET | Freq: Every day | ORAL | Status: DC

## 2015-03-10 MED ORDER — ALBUTEROL SULFATE HFA 108 (90 BASE) MCG/ACT IN AERS: 2.0000 | INHALATION_SPRAY | Freq: Four times a day (QID) | RESPIRATORY_TRACT | Status: DC | PRN

## 2015-03-10 MED ORDER — NYSTATIN 100000 UNIT/GM EX CREA: 1.0000 "application " | TOPICAL_CREAM | Freq: Two times a day (BID) | CUTANEOUS | Status: DC

## 2015-03-10 MED ORDER — IPRATROPIUM BROMIDE HFA 17 MCG/ACT IN AERS: 2.0000 | INHALATION_SPRAY | Freq: Four times a day (QID) | RESPIRATORY_TRACT | Status: DC | PRN

## 2015-03-10 MED ORDER — LORATADINE 10 MG PO TABS: 10.0000 mg | ORAL_TABLET | Freq: Every day | ORAL | Status: DC

## 2015-03-10 NOTE — Assessment & Plan Note (Signed)
Rt side worse. May be weight related. Will get xray of rt foot.

## 2015-03-10 NOTE — Assessment & Plan Note (Signed)
Hx of with PE. 4 yrs ago. If any asymmetric calf swelling, popliteal pain, chest pain, dyspnea then ED evaluation.

## 2015-03-10 NOTE — Patient Instructions (Addendum)
COPD, mild Presently mild on exam. First visit. No pft done. But by lung exam will rx atrovent and albuterol.  DVT Hx of with PE. 4 yrs ago. If any asymmetric calf swelling, popliteal pain, chest pain, dyspnea then ED evaluation.  Snoring Wakes every hour. Snores. Body habitus matches sleep apnea. Will refer to sleep study.  HTN (hypertension) Cbc,cmp,lipid panel today. Rx lisinopril  GERD (gastroesophageal reflux disease) Diet explained. Zantac otc. If becomes daily then PPI.  Cough Likley smoker cough. But some associated with chemical exposure.  Rx flonase and claritin.   But strongly recommend no exposure to work chemical.  Pain in both feet Rt side worse. May be weight related. Will get xray of rt foot.  Pedal edema 1+ both feet and all the up to distal 1/3 pretibial area. Will get cxr.   Fungal infection Nystatin rx.Keep areas dry as possible.  Smoker When bp better controlled consider Wellbutrin/discuss with pt.    Follow up in 2 wks or as needed

## 2015-03-10 NOTE — Assessment & Plan Note (Signed)
Diet explained. Zantac otc. If becomes daily then PPI.

## 2015-03-10 NOTE — Progress Notes (Signed)
Subjective:    Patient ID: Dustin Koch, male    DOB: [birth year], 54 y.o.   MRN: [id number]  HPI  I have reviewed pt PMH, PSH, FH, Social History and Surgical History  Copd- Hx of in past. Pt is a smoker. Smokes about a pack a day for 40 yrs. Pt never really tried to stop. Wheezes almost daily basis   Hx of dvt and pulmonary embolism- In the past had dvt then subsequent PE about 4 yrs ago. He was on coumadin for 6 months back then. No recurrent dvt or PE since then.  Insomnia- Pt wakes up every hour. Pt states has done that for 10 yrs. Pt had used med to sleep in remote past. Pt does snore. He states big time snoring. Pt has gained 32 lb in last year.   Pt has htn- hx of this. He states it does run high usually like today around 147/103. He checks with family member bp cuffs. No cardiac or neurologic signs or symptoms.  Hx of gerd- He gets this one day a week.   Pt is pipe Psychologist, forensic, No exercise, Diet is very poor a lot of fast foods, 2 cups of coffee a day, 2-3 soft drinks a week, Married- no children.  Pulmonary nodule- Pt had this found during the PE work up.   Pt also states he has smokers cough. Then he also states where he is doing work smell ammonia smell at times and this seems to make him cough. Pt states respirator does not help. He has talked with his boss. And boss of facility where he works is aware. Pt states if exposure too extreme he will leave area. Pt states they are in process of getting system to ventilate area.   Pt states both feet hurt on top aspect. Some swelling at top of his feet toward his ankle for about a year.   Also rash on bottom left side and groin on and off. Lotrim helps on and off for 20 yrs.       Review of Systems  Constitutional: Positive for fatigue. Negative for chills and unexpected weight change.  HENT: Positive for congestion. Negative for ear discharge and facial swelling.        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    Respiratory: Positive for cough and wheezing. Negative for choking, chest tightness and shortness of breath.        Snores a lot at night.  Cardiovascular: Negative for chest pain and palpitations.  Gastrointestinal: Negative for nausea, vomiting, abdominal pain, diarrhea, constipation, blood in stool, abdominal distention, anal bleeding and rectal pain.       Reflux one time a week.  Musculoskeletal: Negative for myalgias, back pain and neck stiffness.       Lower ext pedal edema.  Skin: Positive for rash.       Groin rash and lower left buttox.  Neurological: Negative for dizziness, syncope, weakness, numbness and headaches.  Hematological: Negative for adenopathy. Does not bruise/bleed easily.  Psychiatric/Behavioral: Positive for sleep disturbance. Negative for suicidal ideas, confusion, dysphoric mood and agitation. The patient is not nervous/anxious.     Past Medical History  Diagnosis Date  . Asthma   . PE (pulmonary embolism)     acute 08/2009  . Pulmonary nodule     right  . Clotting disorder   . COPD (chronic obstructive pulmonary disease)   . Emphysema of lung   . GERD (gastroesophageal reflux disease)   .  Sleep apnea   . Hypertension     History   Social History  . Marital Status: Single    Spouse Name: N/A  . Number of Children: N/A  . Years of Education: N/A   Occupational History  . Not on file.   Social History Main Topics  . Smoking status: Current Every Day Smoker -- 1.00 packs/day for 30 years    Types: Cigarettes  . Smokeless tobacco: Never Used  . Alcohol Use: Yes     Comment: 1 mixed drink a night.  . Drug Use: No  . Sexual Activity: Yes   Other Topics Concern  . Not on file   Social History Narrative    No past surgical history on file.  Family History  Problem Relation Age of Onset  . Diabetes Mother   . Cancer Father     No Known Allergies  No current outpatient prescriptions on file prior to visit.   No current  facility-administered medications on file prior to visit.    BP 147/103 mmHg  Pulse 79  Temp(Src) 99 F (37.2 C) (Oral)  Ht  (1.854 m)  Wt 265 lb 6.4 oz (120.385 kg)  BMI 35.02 kg/m2  SpO2 95%       Objective:   Physical Exam  General Mental Status- Alert. General Appearance- Not in acute distress.   Skin General: Color- Normal Color. Moisture- Normal Moisture. Lower ext mild stasis dermatitis.   HEENT Head- Normal. Ear Auditory Canal - Left- Normal. Right - Normal.Tympanic Membrane- Left- Normal. Right- Normal. Eye Sclera/Conjunctiva- Left- Normal. Right- Normal. Nose & Sinuses Nasal Mucosa- Left-   Mild Boggy and Congested. Right-   Mild Boggy and  Congested.Bilateral  No maxillary and no  frontal sinus pressure. Mouth & Throat Lips: Upper Lip- Normal: no dryness, cracking, pallor, cyanosis, or vesicular eruption. Lower Lip-Normal: no dryness, cracking, pallor, cyanosis or vesicular eruption. Buccal Mucosa- Bilateral- No Aphthous ulcers. Oropharynx- No Discharge or Erythema. Tonsils: Characteristics- Bilateral- No Erythema or Congestion. Size/Enlargement- Bilateral- No enlargement. Discharge- bilateral-None.  Neck Carotid Arteries- Normal color. Moisture- Normal Moisture. No carotid bruits. No JVD.  Chest and Lung Exam Auscultation: Breath Sounds:-Normal. Even unlabored but scattered wheezing.  Cardiovascular Auscultation:Rythm- Regular, Rate and Rythm Murmurs & Other Heart Sounds:Auscultation of the heart reveals- No Murmurs.  Abdomen Inspection:-Inspeection Normal. Large protuberant obese abdomen Palpation/Percussion:Note:No mass. Palpation and Percussion of the abdomen reveal- Non Tender, Non Distended + BS, no rebound or guarding.    Neurologic Cranial Nerve exam:- CN III-XII intact(No nystagmus), symmetric smile. Finger to Nose:- Normal/Intact Strength:- 5/5 equal and symmetric strength both upper and lower extremities.  Lower ext- negative  homans signs. Symmetric calves. 1+ pedal edema starring at distal 1/3 portion of tibia toward feet. Rt foot mild pain on palpation mid foot. Left side not tender presently.      Assessment & Plan:

## 2015-03-10 NOTE — Assessment & Plan Note (Signed)
1+ both feet and all the up to distal 1/3 pretibial area. Will get cxr.

## 2015-03-10 NOTE — Assessment & Plan Note (Signed)
Wakes every hour. Snores. Body habitus matches sleep apnea. Will refer to sleep study.

## 2015-03-10 NOTE — Assessment & Plan Note (Signed)
Likley smoker cough. But some associated with chemical exposure.  Rx flonase and claritin.   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

## 2015-03-10 NOTE — Assessment & Plan Note (Signed)
Presently mild on exam. First visit. No pft done. But by lung exam will rx atrovent and albuterol.

## 2015-03-10 NOTE — Assessment & Plan Note (Signed)
When bp better controlled consider Wellbutrin/discuss with pt.

## 2015-03-10 NOTE — Progress Notes (Signed)
Pre visit review using our clinic review tool, if applicable. No additional management support is needed unless otherwise documented below in the visit note. 

## 2015-03-10 NOTE — Assessment & Plan Note (Signed)
Cbc,cmp,lipid panel today. Rx lisinopril

## 2015-03-10 NOTE — Assessment & Plan Note (Signed)
Nystatin rx.Keep areas dry as possible.

## 2015-03-24 ENCOUNTER — Ambulatory Visit: Payer: 59 | Admitting: Medical

## 2015-03-31 ENCOUNTER — Encounter: Payer: Self-pay | Admitting: Medical

## 2015-03-31 ENCOUNTER — Ambulatory Visit (INDEPENDENT_AMBULATORY_CARE_PROVIDER_SITE_OTHER): Payer: 59 | Admitting: Medical

## 2015-03-31 VITALS — BP 121/84 | HR 98 | Temp 98.9°F | Ht 73.0 in | Wt 261.2 lb

## 2015-03-31 DIAGNOSIS — M79672 Pain in left foot: Secondary | ICD-10-CM

## 2015-03-31 DIAGNOSIS — L089 Local infection of the skin and subcutaneous tissue, unspecified: Secondary | ICD-10-CM

## 2015-03-31 DIAGNOSIS — B49 Unspecified mycosis: Secondary | ICD-10-CM | POA: Diagnosis not present

## 2015-03-31 DIAGNOSIS — J449 Chronic obstructive pulmonary disease, unspecified: Secondary | ICD-10-CM

## 2015-03-31 DIAGNOSIS — F17200 Nicotine dependence, unspecified, uncomplicated: Secondary | ICD-10-CM

## 2015-03-31 DIAGNOSIS — I10 Essential (primary) hypertension: Secondary | ICD-10-CM | POA: Diagnosis not present

## 2015-03-31 DIAGNOSIS — G629 Polyneuropathy, unspecified: Secondary | ICD-10-CM | POA: Diagnosis not present

## 2015-03-31 DIAGNOSIS — M79671 Pain in right foot: Secondary | ICD-10-CM | POA: Diagnosis not present

## 2015-03-31 DIAGNOSIS — Z720 Tobacco use: Secondary | ICD-10-CM

## 2015-03-31 MED ORDER — LISINOPRIL 10 MG PO TABS: 10.0000 mg | ORAL_TABLET | Freq: Every day | ORAL | Status: DC

## 2015-03-31 MED ORDER — SULFAMETHOXAZOLE-TRIMETHOPRIM 800-160 MG PO TABS: 1.0000 | ORAL_TABLET | Freq: Two times a day (BID) | ORAL | Status: DC

## 2015-03-31 MED ORDER — GABAPENTIN 100 MG PO CAPS: 100.0000 mg | ORAL_CAPSULE | Freq: Three times a day (TID) | ORAL | Status: DC

## 2015-03-31 MED ORDER — TIOTROPIUM BROMIDE MONOHYDRATE 18 MCG IN CAPS: ORAL_CAPSULE | RESPIRATORY_TRACT | Status: DC

## 2015-03-31 MED ORDER — BUPROPION HCL ER (SR) 150 MG PO TB12: ORAL_TABLET | ORAL | Status: DC

## 2015-03-31 MED ORDER — FLUCONAZOLE 150 MG PO TABS: 150.0000 mg | ORAL_TABLET | Freq: Once | ORAL | Status: DC

## 2015-03-31 NOTE — Progress Notes (Signed)
Subjective:    Patient ID: Dustin Koch, male    DOB: 05/24/1961, 54 y.o.   MRN: [id number]  HPI   Pt bp is better than on last visit. Pt is on lisinopril 10 mg a day. No cardiac and no neurologic symptoms.  Pt has years of feet pain. States all his life as teenager. Worse as adult. Rt heel pain as well. Pt has spurr. Pt had never been on neurontin.   Pt sugar was mild elevated on labs last visit.   Pt states rash in groin area did not get better.   Pt wheezing at night improved with atrovent. He is on proventil. 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  Pt is a smoker.  Pt rt side abdomen small area that got burn welding at work about 2 months ago. Slow healing area. Occasinal mild yellow discharge when he presses on the area.  Also lt buttox had red area as well.  Pt still smoking a pack a day.    Review of Systems  Constitutional: Negative for chills, fatigue and unexpected weight change.  HENT: Negative.   Respiratory: Positive for cough and wheezing. Negative for chest tightness and shortness of breath.   Genitourinary: Negative.   Skin: Positive for rash.  Neurological: Negative for dizziness and light-headedness.       Feet pain on walking.  Hematological: Negative for adenopathy. Does not bruise/bleed easily.  Psychiatric/Behavioral: Negative for behavioral problems and confusion.   Past Medical History  Diagnosis Date  . Asthma   . PE (pulmonary embolism)     acute 08/2009  . Pulmonary nodule     right  . Clotting disorder   . COPD (chronic obstructive pulmonary disease)   . Emphysema of lung   . GERD (gastroesophageal reflux disease)   . Sleep apnea   . Hypertension     History   Social History  . Marital Status: Single    Spouse Name: N/A  . Number of Children: N/A  . Years of Education: N/A   Occupational  History  . Not on file.   Social History Main Topics  . Smoking status: Current Every Day Smoker -- 1.00 packs/day for 30 years    Types: Cigarettes  . Smokeless tobacco: Never Used  . Alcohol Use: Yes     Comment: 1 mixed drink a night.  . Drug Use: No  . Sexual Activity: Yes   Other Topics Concern  . Not on file   Social History Narrative    No past surgical history on file.  Family History  Problem Relation Age of Onset  . Diabetes Mother   . Cancer Father     No Known Allergies  Current Outpatient Prescriptions on File Prior to Visit  Medication Sig Dispense Refill  . albuterol (PROVENTIL HFA;VENTOLIN HFA) 108 (90 BASE) MCG/ACT inhaler Inhale 2 puffs into the lungs every 6 (six) hours as needed for wheezing or shortness of breath. 1 Inhaler 0  . fluticasone (FLONASE) 50 MCG/ACT nasal spray Place 2 sprays into both nostrils daily. 16 g 1  . loratadine (CLARITIN) 10 MG tablet Take 1 tablet (10 mg total) by mouth daily. 30 tablet 0  . nystatin cream (MYCOSTATIN) Apply 1 application topically 2 (two) times daily. 30 g 2   No current facility-administered medications on file prior to visit.  BP 121/84 mmHg  Pulse 98  Temp(Src) 98.9 F (37.2 C) (Oral)  Ht  (1.854 m)  Wt 261 lb 3.2 oz (118.48 kg)  BMI 34.47 kg/m2  SpO2 95%       Objective:   Physical Exam  General  Mental Status - Alert. General Appearance - Well groomed. Not in acute distress.  Skin Rashes- No Rashes.  HEENT Head- Normal. Ear Auditory Canal - Left- Normal. Right - Normal.Tympanic Membrane- Left- Normal. Right- Normal. Eye Sclera/Conjunctiva- Left- Normal. Right- Normal. Nose & Sinuses Nasal Mucosa- Left-  Not boggy or Congested. Right-  Not  boggy or Congested. Mouth & Throat Lips: Upper Lip- Normal: no dryness, cracking, pallor, cyanosis, or vesicular eruption. Lower Lip-Normal: no dryness, cracking, pallor, cyanosis or vesicular eruption. Buccal Mucosa- Bilateral- No Aphthous  ulcers. Oropharynx- No Discharge or Erythema. Tonsils: Characteristics- Bilateral- No Erythema or Congestion. Size/Enlargement- Bilateral- No enlargement. Discharge- bilateral-None.  Neck Neck- Supple. No Masses.   Chest and Lung Exam Auscultation: Breath Sounds:- even and unlabored, but bilateral faint occasional expiratory wheeze.  Cardiovascular Auscultation:Rythm- Regular, rate and rhythm. Murmurs & Other Heart Sounds:Ausculatation of the heart reveal- No Murmurs.  Lymphatic Head & Neck General Head & Neck Lymphatics: Bilateral: Description- No Localized lymphadenopathy.  Skin- red mild indurated area 10mm size with central scab rt side abdomen. Same appearance left buttox. But no fluctuant area and no dc presently.  Groin area rash both sides. Red and slight flaky appearance to skin.       Assessment & Plan:  For you htn. Continue lisinopril. Sent in rx.  For neuropathy type pain. Rx neurontin.  For groin fungal infection. Rx diflucan and continue nystatin. While not working/sweating this should improve. If not then refer to dermatologist.  For your copd. Can rx spiriva(see if cheaper?). Since atrovent too expensive. Use albuterol if needed. If this not covered well and cough persists consider pulmonology referral.  Skin infection- rx bactrim ds.  For smoking cessation Wellbutrin rx.  Follow up in 1 months or as needed

## 2015-03-31 NOTE — Patient Instructions (Addendum)
For you htn. Continue lisinopril. Sent in rx.  For neuropathy type pain. Rx neurontin.  For groin fungal infection. Rx diflucan and continue nystatin. While not working/sweating this should improve. If not then refer to dermatologist.  For your copd. Can rx spiriva. Since atrovent too expensive. Use albuterol if needed. If this not covered well and cough persists consider pulmonology referral.  Skin infection- rx bactrim ds.  For smoking cessation Wellbutrin rx.  Follow up in 1 months or as needed

## 2015-03-31 NOTE — Progress Notes (Signed)
Pre visit review using our clinic review tool, if applicable. No additional management support is needed unless otherwise documented below in the visit note. 

## 2015-04-06 ENCOUNTER — Ambulatory Visit: Payer: Self-pay | Admitting: Adult Health

## 2015-06-28 ENCOUNTER — Institutional Professional Consult (permissible substitution): Payer: 59 | Admitting: Internal Medicine

## 2015-11-07 ENCOUNTER — Emergency Department (HOSPITAL_COMMUNITY)
Admission: EM | Admit: 2015-11-07 | Discharge: 2015-11-07 | Disposition: A | Discharge status: Home / Self Care | Payer: 59 | Attending: Emergency Medicine | Admitting: Emergency Medicine

## 2015-11-07 ENCOUNTER — Emergency Department (HOSPITAL_COMMUNITY): Payer: 59

## 2015-11-07 ENCOUNTER — Encounter (HOSPITAL_COMMUNITY): Payer: Self-pay

## 2015-11-07 DIAGNOSIS — I82402 Acute embolism and thrombosis of unspecified deep veins of left lower extremity: Secondary | ICD-10-CM | POA: Insufficient documentation

## 2015-11-07 DIAGNOSIS — I10 Essential (primary) hypertension: Secondary | ICD-10-CM | POA: Diagnosis not present

## 2015-11-07 DIAGNOSIS — F17210 Nicotine dependence, cigarettes, uncomplicated: Secondary | ICD-10-CM | POA: Diagnosis not present

## 2015-11-07 DIAGNOSIS — Z8669 Personal history of other diseases of the nervous system and sense organs: Secondary | ICD-10-CM | POA: Diagnosis not present

## 2015-11-07 DIAGNOSIS — Z79899 Other long term (current) drug therapy: Secondary | ICD-10-CM | POA: Diagnosis not present

## 2015-11-07 DIAGNOSIS — Z8719 Personal history of other diseases of the digestive system: Secondary | ICD-10-CM | POA: Diagnosis not present

## 2015-11-07 DIAGNOSIS — Z862 Personal history of diseases of the blood and blood-forming organs and certain disorders involving the immune mechanism: Secondary | ICD-10-CM | POA: Diagnosis not present

## 2015-11-07 DIAGNOSIS — J439 Emphysema, unspecified: Secondary | ICD-10-CM | POA: Insufficient documentation

## 2015-11-07 DIAGNOSIS — Z86711 Personal history of pulmonary embolism: Secondary | ICD-10-CM | POA: Insufficient documentation

## 2015-11-07 DIAGNOSIS — M79662 Pain in left lower leg: Secondary | ICD-10-CM | POA: Diagnosis present

## 2015-11-07 DIAGNOSIS — I824Z2 Acute embolism and thrombosis of unspecified deep veins of left distal lower extremity: Secondary | ICD-10-CM

## 2015-11-07 LAB — CBC
HCT: 47.6 % (ref 39.0–52.0)
Hemoglobin: 15.6 g/dL (ref 13.0–17.0)
MCH: 31.4 pg (ref 26.0–34.0)
MCHC: 32.8 g/dL (ref 30.0–36.0)
MCV: 95.8 fL (ref 78.0–100.0)
PLATELETS: 211 10*3/uL (ref 150–400)
RBC: 4.97 MIL/uL (ref 4.22–5.81)
RDW: 13.7 % (ref 11.5–15.5)
WBC: 6.9 10*3/uL (ref 4.0–10.5)

## 2015-11-07 LAB — BASIC METABOLIC PANEL
Anion gap: 9 (ref 5–15)
BUN: 14 mg/dL (ref 6–20)
CHLORIDE: 104 mmol/L (ref 101–111)
CO2: 28 mmol/L (ref 22–32)
CREATININE: 1.21 mg/dL (ref 0.61–1.24)
Calcium: 9.4 mg/dL (ref 8.9–10.3)
Glucose, Bld: 217 mg/dL — ABNORMAL HIGH (ref 65–99)
Potassium: 4.7 mmol/L (ref 3.5–5.1)
Sodium: 141 mmol/L (ref 135–145)

## 2015-11-07 LAB — I-STAT TROPONIN, ED: TROPONIN I, POC: 0 ng/mL (ref 0.00–0.08)

## 2015-11-07 LAB — PROTIME-INR
INR: 0.97 (ref 0.00–1.49)
Prothrombin Time: 13.1 seconds (ref 11.6–15.2)

## 2015-11-07 LAB — D-DIMER, QUANTITATIVE (NOT AT ARMC): D DIMER QUANT: 1.02 ug{FEU}/mL — AB (ref 0.00–0.50)

## 2015-11-07 MED ORDER — ENOXAPARIN SODIUM 120 MG/0.8ML ~~LOC~~ SOLN
120.0000 mg | Freq: Once | SUBCUTANEOUS | Status: AC
  Administered 2015-11-07: 120 mg via SUBCUTANEOUS
  Filled 2015-11-07: qty 0.8

## 2015-11-07 MED ORDER — ALBUTEROL SULFATE (2.5 MG/3ML) 0.083% IN NEBU
5.0000 mg | INHALATION_SOLUTION | Freq: Once | RESPIRATORY_TRACT | Status: AC
  Administered 2015-11-07: 5 mg via RESPIRATORY_TRACT
  Filled 2015-11-07: qty 6

## 2015-11-07 MED ORDER — XARELTO VTE STARTER PACK 15 & 20 MG PO TBPK: 15.0000 mg | ORAL_TABLET | ORAL | Status: DC

## 2015-11-07 MED ORDER — HYDROCODONE-ACETAMINOPHEN 5-325 MG PO TABS
1.0000 | ORAL_TABLET | Freq: Once | ORAL | Status: AC
Start: 2015-11-07 — End: 2015-11-07
  Administered 2015-11-07: 1 via ORAL
  Filled 2015-11-07: qty 1

## 2015-11-07 MED ORDER — GABAPENTIN 100 MG PO CAPS: 100.0000 mg | ORAL_CAPSULE | Freq: Three times a day (TID) | ORAL | Status: DC

## 2015-11-07 MED ORDER — IOHEXOL 350 MG/ML SOLN
100.0000 mL | Freq: Once | INTRAVENOUS | Status: AC | PRN
  Administered 2015-11-07: 100 mL via INTRAVENOUS

## 2015-11-07 NOTE — ED Notes (Signed)
Social work at bedside providing patient with necessary paperwork

## 2015-11-07 NOTE — ED Provider Notes (Signed)
CSN: [id number]     Arrival date & time 11/07/15  1133 History   First MD Initiated Contact with Patient 11/07/15 1439     Chief Complaint  Patient presents with  . diagnosed DVT      The history is provided by the patient. No language interpreter was used.   Dustin Koch is a 55 y.o. male who presents to the Emergency Department complaining of left calf pain.  He reports two weeks of left calf pain. He saw his PCP today and had an outpatient ultrasound that demonstrated an acute DVT of the left calf and he was referred to the ED for further eval.  He also reports SOB, which is a chronic problem for him, but slightly worsening recently.  He is a smoker.  He denies any chest pain, abdominal pain, hematochezia.  Sxs are moderate, constant in nature.   Past Medical History  Diagnosis Date  . Asthma   . PE (pulmonary embolism)     acute 08/2009  . Pulmonary nodule     right  . Clotting disorder (HCC)   . COPD (chronic obstructive pulmonary disease) (HCC)   . Emphysema of lung (HCC)   . GERD (gastroesophageal reflux disease)   . Sleep apnea   . Hypertension    History reviewed. No pertinent past surgical history. Family History  Problem Relation Age of Onset  . Diabetes Mother   . Cancer Father    Social History  Substance Use Topics  . Smoking status: Current Every Day Smoker -- 1.00 packs/day for 30 years    Types: Cigarettes  . Smokeless tobacco: Never Used  . Alcohol Use: Yes     Comment: 1 mixed drink a night.    Review of Systems  All other systems reviewed and are negative.     Allergies  Review of patient's allergies indicates no known allergies.  Home Medications   Prior to Admission medications   Medication Sig Start Date End Date Taking? Authorizing Provider  albuterol (PROVENTIL HFA;VENTOLIN HFA) 108 (90 BASE) MCG/ACT inhaler Inhale 2 puffs into the lungs every 6 (six) hours as needed for wheezing or shortness of breath. 03/10/15  Yes Edward Saguier, PA-C   lisinopril (PRINIVIL,ZESTRIL) 10 MG tablet Take 1 tablet (10 mg total) by mouth daily. Patient taking differently: Take 10 mg by mouth daily as needed (for HBP).  03/31/15  Yes Edward Saguier, PA-C  buPROPion [facility] SR) 150 MG 12 hr tablet 1 tab po q day for 3 days then twice daily Patient not taking: Reported on 11/07/2015 03/31/15   Esperanza Richters, PA-C  fluconazole (DIFLUCAN) 150 MG tablet Take 1 tablet (150 mg total) by mouth once. Patient not taking: Reported on 11/07/2015 03/31/15   Esperanza Richters, PA-C  fluticasone [facility]) 50 MCG/ACT nasal spray Place 2 sprays into both nostrils daily. Patient not taking: Reported on 11/07/2015 03/10/15   Esperanza Richters, PA-C  gabapentin (NEURONTIN) 100 MG capsule Take 1 capsule (100 mg total) by mouth 3 (three) times daily. 11/07/15   Tilden Fossa, MD  loratadine (CLARITIN) 10 MG tablet Take 1 tablet (10 mg total) by mouth daily. Patient not taking: Reported on 11/07/2015 03/10/15   Esperanza Richters, PA-C  nystatin cream (MYCOSTATIN) Apply 1 application topically 2 (two) times daily. Patient not taking: Reported on 11/07/2015 03/10/15   Esperanza Richters, PA-C  sulfamethoxazole-trimethoprim (BACTRIM DS,SEPTRA DS) 800-160 MG per tablet Take 1 tablet by mouth 2 (two) times daily. Patient not taking: Reported on 11/07/2015 03/31/15  Esperanza RichtersEdward Saguier, PA-C  tiotropium (SPIRIVA HANDIHALER) 18 MCG inhalation capsule 2 inhalations once daily Patient not taking: Reported on 11/07/2015 03/31/15   Ramon DredgeEdward Saguier, PA-C  XARELTO STARTER PACK 15 & 20 MG TBPK Take 15-20 mg by mouth as directed. Take as directed on package: Start with one 15mg  tablet by mouth twice a day with food. On Day 22, switch to one 20mg  tablet once a day with food. 11/07/15   Tilden FossaElizabeth Finley Chevez, MD   BP 141/83 mmHg  Pulse 99  Temp(Src) 98.1 F (36.7 C) (Oral)  Resp 12  SpO2 96% Physical Exam  Constitutional: He is oriented to person, place, and time. He appears well-developed and well-nourished.  HENT:  Head:  Normocephalic and atraumatic.  Cardiovascular: Normal rate and regular rhythm.   No murmur heard. Pulmonary/Chest: Effort normal. No respiratory distress.  Occasional end expiratory wheezes  Abdominal: Soft. There is no tenderness. There is no rebound and no guarding.  Musculoskeletal:  1+ swelling and tenderness to the left calf.   Neurological: He is alert and oriented to person, place, and time.  Skin: Skin is warm and dry.  Psychiatric: He has a normal mood and affect. His behavior is normal.  Nursing note and vitals reviewed.   ED Course  Procedures (including critical care time) Labs Review Labs Reviewed  BASIC METABOLIC PANEL - Abnormal; Notable for the following:    Glucose, Bld 217 (*)    All other components within normal limits  D-DIMER, QUANTITATIVE (NOT [facility]) - Abnormal; Notable for the following:    D-Dimer, Quant 1.02 (*)    All other components within normal limits  CBC  PROTIME-INR  I-STAT TROPOININ, ED    Imaging Review Ct Angio Chest Pe W/cm &/or Wo Cm  11/07/2015  CLINICAL DATA:  Patient with shortness of breath.  History of DVT. EXAM: CT ANGIOGRAPHY CHEST WITH CONTRAST TECHNIQUE: Multidetector CT imaging of the chest was performed using the standard protocol during bolus administration of intravenous contrast. Multiplanar CT image reconstructions and MIPs were obtained to evaluate the vascular anatomy. CONTRAST:  100mL OMNIPAQUE IOHEXOL 350 MG/ML SOLN COMPARISON:  E CT 08/13/2009 FINDINGS: Mediastinum/Nodes: Heart is normal in size. No pericardial effusion. Aorta and main pulmonary artery normal in caliber. No axillary, mediastinal or hilar lymphadenopathy. Adequate opacification the pulmonary arterial system. No evidence for pulmonary embolism. Lungs/Pleura: Expiratory phase images. No large area of pulmonary consolidation. Small bulla within the lingula. Biapical paraseptal emphysematous change. No pleural effusion or pneumothorax. 4 mm right middle lobe  pulmonary nodule (image 280; series 6) is stable dating back to 2010, compatible with benign etiology. Upper abdomen: Liver is diffusely low in attenuation compatible with steatosis. Fatty sparing adjacent to the gallbladder fossa. The left kidney is not visualized. Musculoskeletal: No aggressive or acute appearing osseous lesions. Review of the MIP images confirms the above findings. IMPRESSION: No evidence for pulmonary embolism. No acute process within the chest. The left kidney is not visualized. Recommend correlation for possibility of prior surgical removal. Otherwise, further evaluation with abdominal/renal ultrasound could be performed. Hepatic steatosis. Electronically Signed   By: Annia Beltrew  Davis M.D.   On: 11/07/2015 16:47   I have personally reviewed and evaluated these images and lab results as part of my medical decision-making.   EKG Interpretation   Date/Time:  Monday November 07 2015 15:05:40 EST Ventricular Rate:  78 PR Interval:  156 QRS Duration: 91 QT Interval:  379 QTC Calculation: 432 R Axis:   21 Text Interpretation:  Sinus  rhythm Confirmed by Lincoln Brigham [phone]) on  11/07/2015 3:11:43 PM      MDM   Final diagnoses:  Calf DVT (deep venous thrombosis), left   Pt with outpatient imaging (available through care everywhere) demonstrates acute calf DVT of the LLE.  He endorses acute on chronic SOB, some wheezing on exam.  CT PE study negative for acute PE.  No evidence of acute pna or CHF.  Provided albuterol with no significant change in his dyspnea.  He has no respiratory distress in the department.  Discussed treatment for DVT and risks/benefits of anticoagulation.  Started on xarelto.  Also discussed smoking cessation.  Discussed PCP follow up, return precautions.     Tilden Fossa, MD 11/08/15 [phone]

## 2015-11-07 NOTE — ED Notes (Signed)
Patient here with left leg pain for 1 week and was diagnosed with DVT in past with PE's and today diagnosed with LLL DVT. Reports some intermittent shortness of breath

## 2015-11-07 NOTE — Discharge Instructions (Signed)
Deep Vein Thrombosis °A deep vein thrombosis (DVT) is a blood clot (thrombus) that usually occurs in a deep, larger vein of the lower leg or the pelvis, or in an upper extremity such as the arm. These are dangerous and can lead to serious and even life-threatening complications if the clot travels to the lungs. °A DVT can damage the valves in your leg veins so that instead of flowing upward, the blood pools in the lower leg. This is called post-thrombotic syndrome, and it can result in pain, swelling, discoloration, and sores on the leg. °CAUSES °A DVT is caused by the formation of a blood clot in your leg, pelvis, or arm. Usually, several things contribute to the formation of blood clots. A clot may develop when: °· Your blood flow slows down. °· Your vein becomes damaged in some way. °· You have a condition that makes your blood clot more easily. °RISK FACTORS °A DVT is more likely to develop in: °· People who are older, especially over 60 years of age. °· People who are overweight (obese). °· People who sit or lie still for a long time, such as during long-distance travel (over 4 hours), bed rest, hospitalization, or during recovery from certain medical conditions like a stroke. °· People who do not engage in much physical activity (sedentary lifestyle). °· People who have chronic breathing disorders. °· People who have a personal or family history of blood clots or blood clotting disease. °· People who have peripheral vascular disease (PVD), diabetes, or some types of cancer. °· People who have heart disease, especially if the person had a recent heart attack or has congestive heart failure. °· People who have neurological diseases that affect the legs (leg paresis). °· People who have had a traumatic injury, such as breaking a hip or leg. °· People who have recently had major or lengthy surgery, especially on the hip, knee, or abdomen. °· People who have had a central line placed inside a large vein. °· People  who take medicines that contain the hormone estrogen. These include birth control pills and hormone replacement therapy. °· Pregnancy or during childbirth or the postpartum period. °· Long plane flights (over 8 hours). °SIGNS AND SYMPTOMS °Symptoms of a DVT can include:  °· Swelling of your leg or arm, especially if one side is much worse. °· Warmth and redness of your leg or arm, especially if one side is much worse. °· Pain in your arm or leg. If the clot is in your leg, symptoms may be more noticeable or worse when you stand or walk. °· A feeling of pins and needles, if the clot is in the arm. °The symptoms of a DVT that has traveled to the lungs (pulmonary embolism, PE) usually start suddenly and include: °· Shortness of breath while active or at rest. °· Coughing or coughing up blood or blood-tinged mucus. °· Chest pain that is often worse with deep breaths. °· Rapid or irregular heartbeat. °· Feeling light-headed or dizzy. °· Fainting. °· Feeling anxious. °· Sweating. °There may also be pain and swelling in a leg if that is where the blood clot started. °These symptoms may represent a serious problem that is an emergency. Do not wait to see if the symptoms will go away. Get medical help right away. Call your local emergency services (911 in the [place] Do not drive yourself to the hospital. °DIAGNOSIS °Your health care provider will take a medical history and perform a physical exam. You may also   have other tests, including: °· Blood tests to assess the clotting properties of your blood. °· Imaging tests, such as CT, ultrasound, MRI, X-ray, and other tests to see if you have clots anywhere in your body. °TREATMENT °After a DVT is identified, it can be treated. The type of treatment that you receive depends on many factors, such as the cause of your DVT, your risk for bleeding or developing more clots, and other medical conditions that you have. Sometimes, a combination of treatments is necessary. Treatment  options may be combined and include: °· Monitoring the blood clot with ultrasound. °· Taking medicines by mouth, such as newer blood thinners (anticoagulants), thrombolytics, or warfarin. °· Taking anticoagulant medicine by injection or through an IV tube. °· Wearing compression stockings or using different types of devices. °· Surgery (rare) to remove the blood clot or to place a filter in your abdomen to stop the blood clot from traveling to your lungs. °Treatments for a DVT are often divided into immediate treatment and long-term treatment (up to 3 months after DVT). You can work with your health care provider to choose the treatment program that is best for you. °HOME CARE INSTRUCTIONS °If you are taking a newer oral anticoagulant: °· Take the medicine every single day at the same time each day. °· Understand what foods and drugs interact with this medicine. °· Understand that there are no regular blood tests required when using this medicine. °· Understand the side effects of this medicine, including excessive bruising or bleeding. Ask your health care provider or pharmacist about other possible side effects. °If you are taking warfarin: °· Understand how to take warfarin and know which foods can affect how warfarin works in your body. °· Understand that it is dangerous to take too much or too little warfarin. Too much warfarin increases the risk of bleeding. Too little warfarin continues to allow the risk for blood clots. °· Follow your PT and INR blood testing schedule. The PT and INR results allow your health care provider to adjust your dose of warfarin. It is very important that you have your PT and INR tested as often as told by your health care provider. °· Avoid major changes in your diet, or tell your health care provider before you change your diet. Arrange a visit with a registered dietitian to answer your questions. Many foods, especially foods that are high in vitamin K, can interfere with warfarin  and affect the PT and INR results. Eat a consistent amount of foods that are high in vitamin K, such as: °¨ Spinach, kale, broccoli, cabbage, collard greens, turnip greens, Brussels sprouts, peas, cauliflower, seaweed, and parsley. °¨ Beef liver and pork liver. °¨ Green tea. °¨ Soybean oil. °· Tell your health care provider about any and all medicines, vitamins, and supplements that you take, including aspirin and other over-the-counter anti-inflammatory medicines. Be especially cautious with aspirin and anti-inflammatory medicines. Do not take those before you ask your health care provider if it is safe to do so. This is important because many medicines can interfere with warfarin and affect the PT and INR results. °· Do not start or stop taking any over-the-counter or prescription medicine unless your health care provider or pharmacist tells you to do so. °If you take warfarin, you will also need to do these things: °· Hold pressure over cuts for longer than usual. °· Tell your dentist and other health care providers that you are taking warfarin before you have any procedures in which   bleeding may occur. °· Avoid alcohol or drink very small amounts. Tell your health care provider if you change your alcohol intake. °· Do not use tobacco products, including cigarettes, chewing tobacco, and e-cigarettes. If you need help quitting, ask your health care provider. °· Avoid contact sports. °General Instructions °· Take over-the-counter and prescription medicines only as told by your health care provider. Anticoagulant medicines can have side effects, including easy bruising and difficulty stopping bleeding. If you are prescribed an anticoagulant, you will also need to do these things: °¨ Hold pressure over cuts for longer than usual. °¨ Tell your dentist and other health care providers that you are taking anticoagulants before you have any procedures in which bleeding may occur. °¨ Avoid contact sports. °· Wear a medical  alert bracelet or carry a medical alert card that says you have had a PE. °· Ask your health care provider how soon you can go back to your normal activities. Stay active to prevent new blood clots from forming. °· Make sure to exercise while traveling or when you have been sitting or standing for a long period of time. It is very important to exercise. Exercise your legs by walking or by tightening and relaxing your leg muscles often. Take frequent walks. °· Wear compression stockings as told by your health care provider to help prevent more blood clots from forming. °· Do not use tobacco products, including cigarettes, chewing tobacco, and e-cigarettes. If you need help quitting, ask your health care provider. °· Keep all follow-up appointments with your health care provider. This is important. °PREVENTION °Take these actions to decrease your risk of developing another DVT: °· Exercise regularly. For at least 30 minutes every day, engage in: °¨ Activity that involves moving your arms and legs. °¨ Activity that encourages good blood flow through your body by increasing your heart rate. °· Exercise your arms and legs every hour during long-distance travel (over 4 hours). Drink plenty of water and avoid drinking alcohol while traveling. °· Avoid sitting or lying in bed for long periods of time without moving your legs. °· Maintain a weight that is appropriate for your height. Ask your health care provider what weight is healthy for you. °· If you are a woman who is over 35 years of age, avoid unnecessary use of medicines that contain estrogen. These include birth control pills. °· Do not smoke, especially if you take estrogen medicines. If you need help quitting, ask your health care provider. °If you are hospitalized, prevention measures may include: °· Early walking after surgery, as soon as your health care provider says that it is safe. °· Receiving anticoagulants to prevent blood clots. If you cannot take  anticoagulants, other options may be available, such as wearing compression stockings or using different types of devices. °SEEK IMMEDIATE MEDICAL CARE IF: °· You have new or increased pain, swelling, or redness in an arm or leg. °· You have numbness or tingling in an arm or leg. °· You have shortness of breath while active or at rest. °· You have chest pain. °· You have a rapid or irregular heartbeat. °· You feel light-headed or dizzy. °· You cough up blood. °· You notice blood in your vomit, bowel movement, or urine. °These symptoms may represent a serious problem that is an emergency. Do not wait to see if the symptoms will go away. Get medical help right away. Call your local emergency services (911 in the [place] Do not drive yourself to the hospital. °  °  This information is not intended to replace advice given to you by your health care provider. Make sure you discuss any questions you have with your health care provider.   Document Released: 08/20/2005 Document Revised: 05/11/2015 Document Reviewed: 12/15/2014 Elsevier Interactive Patient Education 2016 [employer]. Steps to Quit Smoking  Smoking tobacco can be harmful to your health and can affect almost every organ in your body. Smoking puts you, and those around you, at risk for developing many serious chronic diseases. Quitting smoking is difficult, but it is one of the best things that you can do for your health. It is never too late to quit. WHAT ARE THE BENEFITS OF QUITTING SMOKING? When you quit smoking, you lower your risk of developing serious diseases and conditions, such as:  Lung cancer or lung disease, such as COPD.  Heart disease.  Stroke.  Heart attack.  Infertility.  Osteoporosis and bone fractures. Additionally, symptoms such as coughing, wheezing, and shortness of breath may get better when you quit. You may also find that you get sick less often because your body is stronger at fighting off colds and infections. If you  are pregnant, quitting smoking can help to reduce your chances of having a baby of low birth weight. HOW DO I GET READY TO QUIT? When you decide to quit smoking, create a plan to make sure that you are successful. Before you quit:  Pick a date to quit. Set a date within the next two weeks to give you time to prepare.  Write down the reasons why you are quitting. Keep this list in places where you will see it often, such as on your bathroom mirror or in your car or wallet.  Identify the people, places, things, and activities that make you want to smoke (triggers) and avoid them. Make sure to take these actions:  Throw away all cigarettes at home, at work, and in your car.  Throw away smoking accessories, such as Set designer.  Clean your car and make sure to empty the ashtray.  Clean your home, including curtains and carpets.  Tell your family, friends, and coworkers that you are quitting. Support from your loved ones can make quitting easier.  Talk with your health care provider about your options for quitting smoking.  Find out what treatment options are covered by your health insurance. WHAT STRATEGIES CAN I USE TO QUIT SMOKING?  Talk with your healthcare provider about different strategies to quit smoking. Some strategies include:  Quitting smoking altogether instead of gradually lessening how much you smoke over a period of time. Research shows that quitting "cold [place]" is more successful than gradually quitting.  Attending in-person counseling to help you build problem-solving skills. You are more likely to have success in quitting if you attend several counseling sessions. Even short sessions of 10 minutes can be effective.  Finding resources and support systems that can help you to quit smoking and remain smoke-free after you quit. These resources are most helpful when you use them often. They can include:  Online chats with a Veterinary surgeon.  Telephone  quitlines.  Printed Materials engineer.  Support groups or group counseling.  Text messaging programs.  Mobile phone applications.  Taking medicines to help you quit smoking. (If you are pregnant or breastfeeding, talk with your health care provider first.) Some medicines contain nicotine and some do not. Both types of medicines help with cravings, but the medicines that include nicotine help to relieve withdrawal symptoms. Your health care  provider may recommend:  Nicotine patches, gum, or lozenges.  Nicotine inhalers or sprays.  Non-nicotine medicine that is taken by mouth. Talk with your health care provider about combining strategies, such as taking medicines while you are also receiving in-person counseling. Using these two strategies together makes you more likely to succeed in quitting than if you used either strategy on its own. If you are pregnant or breastfeeding, talk with your health care provider about finding counseling or other support strategies to quit smoking. Do not take medicine to help you quit smoking unless told to do so by your health care provider. WHAT THINGS CAN I DO TO MAKE IT EASIER TO QUIT? Quitting smoking might feel overwhelming at first, but there is a lot that you can do to make it easier. Take these important actions:  Reach out to your family and friends and ask that they support and encourage you during this time. Call telephone quitlines, reach out to support groups, or work with a counselor for support.  Ask people who smoke to avoid smoking around you.  Avoid places that trigger you to smoke, such as bars, parties, or smoke-break areas at work.  Spend time around people who do not smoke.  Lessen stress in your life, because stress can be a smoking trigger for some people. To lessen stress, try:  Exercising regularly.  Deep-breathing exercises.  Yoga.  Meditating.  Performing a body scan. This involves closing your eyes, scanning your  body from head to toe, and noticing which parts of your body are particularly tense. Purposefully relax the muscles in those areas.  Download or purchase mobile phone or tablet apps (applications) that can help you stick to your quit plan by providing reminders, tips, and encouragement. There are many free apps, such as QuitGuide from the [employer] developer(Centers for Disease Control and Prevention). You can find other support for quitting smoking (smoking cessation) through smokefree.gov and other websites. HOW WILL I FEEL WHEN I QUIT SMOKING? Within the first 24 hours of quitting smoking, you may start to feel some withdrawal symptoms. These symptoms are usually most noticeable 2-3 days after quitting, but they usually do not last beyond 2-3 weeks. Changes or symptoms that you might experience include:  Mood swings.  Restlessness, anxiety, or irritation.  Difficulty concentrating.  Dizziness.  Strong cravings for sugary foods in addition to nicotine.  Mild weight gain.  Constipation.  Nausea.  Coughing or a sore throat.  Changes in how your medicines work in your body.  A depressed mood.  Difficulty sleeping (insomnia). After the first 2-3 weeks of quitting, you may start to notice more positive results, such as:  Improved sense of smell and taste.  Decreased coughing and sore throat.  Slower heart rate.  Lower blood pressure.  Clearer skin.  The ability to breathe more easily.  Fewer sick days. Quitting smoking is very challenging for most people. Do not get discouraged if you are not successful the first time. Some people need to make many attempts to quit before they achieve long-term success. Do your best to stick to your quit plan, and talk with your health care provider if you have any questions or concerns.   This information is not intended to replace advice given to you by your health care provider. Make sure you discuss any questions you have with your health care  provider.   Document Released: 08/14/2001 Document Revised: 01/04/2015 Document Reviewed: 01/04/2015 Elsevier Interactive Patient Education [employer] Inc[employer]

## 2015-11-07 NOTE — ED Notes (Signed)
Pt able to ambulate independently 

## 2015-11-08 ENCOUNTER — Telehealth (HOSPITAL_BASED_OUTPATIENT_CLINIC_OR_DEPARTMENT_OTHER): Payer: Self-pay | Admitting: Emergency Medicine

## 2016-03-30 ENCOUNTER — Ambulatory Visit: Payer: 59 | Admitting: Family Medicine

## 2016-05-11 ENCOUNTER — Ambulatory Visit: Payer: 59 | Admitting: Family Medicine

## 2016-06-01 ENCOUNTER — Ambulatory Visit: Payer: 59 | Admitting: Family Medicine

## 2016-06-25 ENCOUNTER — Encounter: Payer: Self-pay | Admitting: Family Medicine

## 2016-06-25 ENCOUNTER — Ambulatory Visit (INDEPENDENT_AMBULATORY_CARE_PROVIDER_SITE_OTHER): Payer: 59 | Admitting: Family Medicine

## 2016-06-25 VITALS — BP 144/80 | HR 84 | Resp 12 | Ht 73.0 in | Wt 269.2 lb

## 2016-06-25 DIAGNOSIS — E1149 Type 2 diabetes mellitus with other diabetic neurological complication: Secondary | ICD-10-CM | POA: Diagnosis not present

## 2016-06-25 DIAGNOSIS — J441 Chronic obstructive pulmonary disease with (acute) exacerbation: Secondary | ICD-10-CM

## 2016-06-25 DIAGNOSIS — G894 Chronic pain syndrome: Secondary | ICD-10-CM | POA: Diagnosis not present

## 2016-06-25 DIAGNOSIS — I10 Essential (primary) hypertension: Secondary | ICD-10-CM | POA: Diagnosis not present

## 2016-06-25 DIAGNOSIS — G629 Polyneuropathy, unspecified: Secondary | ICD-10-CM

## 2016-06-25 DIAGNOSIS — Z6835 Body mass index (BMI) 35.0-35.9, adult: Secondary | ICD-10-CM

## 2016-06-25 HISTORY — DX: Polyneuropathy, unspecified: G62.9

## 2016-06-25 MED ORDER — IPRATROPIUM-ALBUTEROL 0.5-2.5 (3) MG/3ML IN SOLN
3.0000 mL | Freq: Once | RESPIRATORY_TRACT | Status: AC
  Administered 2016-06-25: 3 mL via RESPIRATORY_TRACT

## 2016-06-25 NOTE — Progress Notes (Addendum)
HPI:   [name] is a 55 y.o. male, who is here today to establish care with me.  Former PCP: N/A, still following with Dustin Koch at [facility] .  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 He goes home every other weekend.  Last preventive routine visit: years ago, not sure.    Concerns today: recent Dx with DM II, he is not sure about A1c value, he was supposed to follow up with PCP to discuss lab results and treatment options. He is reporting history of prediabetes for 1-2 years, he has a glucometer at home and reporting fasting BS between 130s and 160.  + Smoker for 40 years.   He denies history of hypertension but has been on Lisinopril before, his BP mildly elevated today. He doesn't check BP at home.  Denies severe/frequent headache, visual changes, chest pain, palpitation, claudication, focal weakness, or edema.  Hx of DVT LLE x 2, last one 11/2015; currently he is on Xarelto 20 mg daily.  He does not exercise regularly and has not been consistent with a healthy diet. History of chronic back pain, according to patient, since age 57, he has not follow with orthopedist before. He is also reporting history of "burning" sensation, distal lower extremities also for many years, according to patient, he has had it "forever." Reporting "scans" done and "nothing wrong" was found.   Back pain is occasionally radiated to LE, mild, 2-4/10,intermittently. Burning sensation on pretibial area is severe, he is on chronic opioid treatment, recently changed from Oxycodone to Hydrocodone-Acetaminophen 10-325 mg bid prn (a week ago), which he does not feel like helps as well as Oxycodone did. He has f/u appt in 08/2016. + Stiffness. Pain seems to be worse in the morning when first gets up and better after movement.  He also takes Gabapentin 300 mg, prescribed tid but he takes at night.  COPD: Emphysema. He uses Albuterol inh  bid daily. + Intermittent wheezing and cough, exertional dyspnea with "heavy activities." No associated diaphoresis or chest pain. For the past few days of worsening rhinorrhea, nasal congestion, and postnasal drainage. He seems to be exacerbated when he travels home back and fourth.    Review of Systems  Constitutional: Negative for activity change, appetite change, fatigue, fever and unexpected weight change.  HENT: Positive for congestion, postnasal drip, rhinorrhea and sneezing. Negative for nosebleeds, sore throat and trouble swallowing.   Eyes: Negative for pain, redness and visual disturbance.  Respiratory: Positive for cough, shortness of breath and wheezing. Negative for chest tightness.   Cardiovascular: Negative for chest pain, palpitations and leg swelling.  Gastrointestinal: Negative for abdominal pain, nausea and vomiting.       Denies changes in bowel habits.  Endocrine: Negative for polydipsia, polyphagia and polyuria.  Genitourinary: Negative for decreased urine volume, dysuria and hematuria.  Musculoskeletal: Positive for back pain. Negative for myalgias.  Skin: Negative for color change and rash.  Neurological: Negative for dizziness, syncope, weakness and headaches.  Psychiatric/Behavioral: Negative for confusion. The patient is not nervous/anxious.       Current Outpatient Prescriptions on File Prior to Visit  Medication Sig Dispense Refill  . albuterol (PROVENTIL HFA;VENTOLIN HFA) 108 (90 BASE) MCG/ACT inhaler Inhale 2 puffs into the lungs every 6 (six) hours as needed for wheezing or shortness of breath. 1 Inhaler 0  . gabapentin (NEURONTIN) 100 MG capsule Take 1 capsule (100 mg total)  by mouth 3 (three) times daily. 30 capsule 0  . lisinopril (PRINIVIL,ZESTRIL) 10 MG tablet Take 1 tablet (10 mg total) by mouth daily. (Patient taking differently: Take 10 mg by mouth daily as needed (for HBP). ) 30 tablet 3   No current facility-administered medications on file  prior to visit.      Past Medical History:  Diagnosis Date  . Asthma   . Clotting disorder (HCC)   . COPD (chronic obstructive pulmonary disease) (HCC)   . Diabetes mellitus without complication (HCC)   . Emphysema of lung (HCC)   . GERD (gastroesophageal reflux disease)   . Hypertension   . PE (pulmonary embolism)    acute 08/2009  . Pulmonary nodule    right  . Sleep apnea    No Known Allergies  Family History  Problem Relation Age of Onset  . Diabetes Mother   . Cancer Father     Social History   Social History  . Marital status: Single    Spouse name: N/A  . Number of children: N/A  . Years of education: N/A   Social History Main Topics  . Smoking status: Current Every Day Smoker    Packs/day: 1.00    Years: 30.00    Types: Cigarettes  . Smokeless tobacco: Never Used  . Alcohol use Yes     Comment: 1 mixed drink a night.  . Drug use: No  . Sexual activity: Yes   Other Topics Concern  . None   Social History Narrative  . None    Vitals:   06/25/16 1515  BP: (!) 144/80  Pulse: 84  Resp: 12   O2 sat 97% at RA.  Body mass index is 35.52 kg/m.     Physical Exam  Nursing note and vitals reviewed. Constitutional: He is oriented to person, place, and time. He appears well-developed. No distress.  HENT:  Head: Atraumatic.  Nose: Rhinorrhea present.  Mouth/Throat: Oropharynx is clear and moist and mucous membranes are normal. Abnormal dentition (poor dentition).  Nasal voice, mouth breathing.  Eyes: Conjunctivae and EOM are normal. Pupils are equal, round, and reactive to light.  Neck: No JVD present. No thyroid mass and no thyromegaly present.  Cardiovascular: Normal rate and regular rhythm.   No murmur heard. Pulses:      Dorsalis pedis pulses are 2+ on the right side, and 2+ on the left side.  Respiratory: Effort normal. No respiratory distress. He has wheezes (diffuse bilateral). He has no rales.  GI: Soft. He exhibits no mass. There is  no hepatomegaly. There is no tenderness.  Musculoskeletal: He exhibits no edema.  No tenderness upon palpation of paraspinal muscles lumbar bilateral. Right knee mildly decreased extension. Knee crepitus bilateral, no pain elicited.   Neurological: He is alert and oriented to person, place, and time. He has normal strength. Coordination and gait normal.  Reflex Scores:      Patellar reflexes are 2+ on the right side and 2+ on the left side. Mildly decreased sensation pretibial area R>L (monifilament). SLR negative bilateral.  Skin: Skin is warm. No erythema.  Psychiatric: He has a normal mood and affect. Cognition and memory are normal.  Poor groomed, good eye contact.   Diabetic foot exam:  Monofilament normal bilateral. Peripheral pulses present (DP). No calluses No hypertrophic/long toenails.    ASSESSMENT AND PLAN:    Dustin Koch was seen today for establish care.  Diagnoses and all orders for this visit:   DM (diabetes mellitus), type  2 with neurological complications [facility])  He tried to reach provider and left message, no call back at the time we completed visit. Will try to obtain lab results before pharmacologic recommendations.  Regular exercise and healthy diet with avoidance of added sugar food intake is an important part of treatment and recommended. Annual eye exam, periodic dental and foot care recommended. Next OV planning on influenza and Pneumovax vaccination.   F/U in 4-6 months.  COPD exacerbation (HCC)  Encouraged smoking cessation.  After DuoNeb breathing treatment and wheezing has improved, no rales or rhonchi.  He agrees with stopping Symbicort 160-4.5 g 2 puffs twice daily. Albuterol 2 puffs every 4 hours for a week. Some side effects of medications discussed.  -Do not think oral steroids are needed at this time, mainly given the fact he was recently diagnosed with diabetes.  Instructed about warning signs. He prefers to hold on imaging  today.  Follow-up in 3-4 weeks.  -     ipratropium-albuterol (DUONEB) 0.5-2.5 (3) MG/3ML nebulizer solution 3 mL; Take 3 mLs by nebulization once.  Essential hypertension  According to records he has history of HTN and was previously on Lisinopril.  I recommend monitoring BP at home and explained he BP still elevated next office visit I will start pharmacologic treatment. Low salt diet and wt loss may help. F/U in 3-4 weeks.  Chronic pain disorder  I will not take over pain management. I will wait until I get records, we may need referral to orthopedist and/or pain management. Some side effects of medications discussed.  I do not see prescription on [place] controlled subs report.   Peripheral polyneuropathy (HCC)  Reporting new onset DM II and has had neuropathic pain for years, so not sure about etiology. According to pt,he has had some imaging done but no ortho or neuro evaluation. We discussed some treatment options, agrees with starting Cymbalta 30 mg daily, some side effects discussed. Gabapentin increased from 300 mg to 600 mg at bedtime. F/U in 3-4 weeks.   BMI 35.0-35.9,adult  We discussed benefits of wt loss as well as adverse effects of obesity. Consistency with healthy diet and physical activity recommended.   He reported recent lab work, so not labs today, will repeat or order labs next OV accordingly. At the time of the visit I could not access records, not Internet connection available.       Dustin Wissmann G. [place], MD  [facility]. [place] office.

## 2016-06-25 NOTE — Progress Notes (Signed)
Pre visit review using our clinic review tool, if applicable. No additional management support is needed unless otherwise documented below in the visit note. 

## 2016-06-25 NOTE — Patient Instructions (Signed)
No Internet access 

## 2016-09-06 ENCOUNTER — Ambulatory Visit (INDEPENDENT_AMBULATORY_CARE_PROVIDER_SITE_OTHER): Payer: 59 | Admitting: Family Medicine

## 2016-09-06 ENCOUNTER — Encounter: Payer: Self-pay | Admitting: Family Medicine

## 2016-09-06 VITALS — BP 140/80 | HR 93 | Resp 12 | Ht 73.0 in | Wt 264.2 lb

## 2016-09-06 DIAGNOSIS — E1149 Type 2 diabetes mellitus with other diabetic neurological complication: Secondary | ICD-10-CM | POA: Diagnosis not present

## 2016-09-06 DIAGNOSIS — J449 Chronic obstructive pulmonary disease, unspecified: Secondary | ICD-10-CM | POA: Diagnosis not present

## 2016-09-06 DIAGNOSIS — K219 Gastro-esophageal reflux disease without esophagitis: Secondary | ICD-10-CM | POA: Diagnosis not present

## 2016-09-06 DIAGNOSIS — G629 Polyneuropathy, unspecified: Secondary | ICD-10-CM | POA: Diagnosis not present

## 2016-09-06 DIAGNOSIS — R059 Cough, unspecified: Secondary | ICD-10-CM

## 2016-09-06 DIAGNOSIS — R05 Cough: Secondary | ICD-10-CM

## 2016-09-06 DIAGNOSIS — K047 Periapical abscess without sinus: Secondary | ICD-10-CM

## 2016-09-06 MED ORDER — DULOXETINE HCL 60 MG PO CPEP: 60.0000 mg | ORAL_CAPSULE | Freq: Every day | ORAL | 1 refills | Status: DC

## 2016-09-06 MED ORDER — OMEPRAZOLE 40 MG PO CPDR: 40.0000 mg | DELAYED_RELEASE_CAPSULE | Freq: Every day | ORAL | 0 refills | Status: DC

## 2016-09-06 MED ORDER — BUDESONIDE-FORMOTEROL FUMARATE 160-4.5 MCG/ACT IN AERO: 2.0000 | INHALATION_SPRAY | Freq: Two times a day (BID) | RESPIRATORY_TRACT | 3 refills | Status: DC

## 2016-09-06 MED ORDER — HYDROCODONE-ACETAMINOPHEN 5-325 MG PO TABS: 1.0000 | ORAL_TABLET | Freq: Every evening | ORAL | 0 refills | Status: DC | PRN

## 2016-09-06 MED ORDER — GLUCOSE BLOOD VI STRP: ORAL_STRIP | 2 refills | Status: DC

## 2016-09-06 MED ORDER — AMOXICILLIN 500 MG PO CAPS: 500.0000 mg | ORAL_CAPSULE | Freq: Three times a day (TID) | ORAL | 0 refills | Status: AC

## 2016-09-06 MED ORDER — ALBUTEROL SULFATE HFA 108 (90 BASE) MCG/ACT IN AERS: 2.0000 | INHALATION_SPRAY | Freq: Four times a day (QID) | RESPIRATORY_TRACT | 1 refills | Status: DC | PRN

## 2016-09-06 NOTE — Progress Notes (Signed)
Pre visit review using our clinic review tool, if applicable. No additional management support is needed unless otherwise documented below in the visit note. 

## 2016-09-06 NOTE — Progress Notes (Signed)
HPI:   Dustin Koch is a 56 y.o. male, who is here today to follow on some of his chronic medical problems, he has other concerns and requests today. He is requesting Abx treatment for a  "abscess", upper left toothache and gum edema for a few days. He has had similar problem in the past and has been treated with Keflex. He had some abx left and started it about 2-3 days ago, has already noted improvement. He denies fever, chills, odynophagia, or dysphagia. He has not arranged appt with dentis.  He is also requesting refills on "cough" medication, Robitussin AC. He has had cough mainly at night that interferes with sleep.  Hx of COPD, last OV I recommended Symbicort 80-4.5 mcg bid. He states that he has taking medication as instructed and has noted great improvement in exertional dyspnea. Still having occasional dyspnea and wheezing. He has not used Albuterol inh because ran out. + Wheezing almost every night.  Still smoking.  Hx of GERD, he denies heartburn.  Denies abdominal pain, nausea, vomiting, changes in bowel habits, blood in stool or melena.   Diabetes Mellitus II:  Currently on non pharmacologic treatment.  Checking BS's : 120's-130's, checking it at night, states that he is not sure if he is doing it before or after meals.  Hypoglycemia: Denies  He denies polydipsia, polyuria, or polyphagia. +Hx of peripheral neuropathy, symptoms are mainly at night, burning sensation bilateral. He is requesting something for pain. He has had Oxycodone 20 mg and Hydrocodone-Acetaminophen.  He also has chronic lower back pain, intermittently radiated to LLE, stable over all. Pain is mild, exacerbated by movement and relieved by rest. He denies saddle anesthesia or urine/bowel incontinence.  Last OV Gabapentin was increased from 300 mg to 600 mg.  Also Cymbalta 30 mg was recommended, which he states he has tolerated well but has not helped at all with pain.   Lab Results    Component Value Date   CREATININE 1.21 11/07/2015   BUN 14 11/07/2015   NA 141 11/07/2015   K 4.7 11/07/2015   CL 104 11/07/2015   CO2 28 11/07/2015     Review of Systems  Constitutional: Negative for activity change, appetite change, fatigue, fever and unexpected weight change.  HENT: Positive for dental problem. Negative for mouth sores, nosebleeds, sore throat and trouble swallowing.   Eyes: Negative for redness and visual disturbance.  Respiratory: Positive for cough, shortness of breath and wheezing.   Cardiovascular: Negative for chest pain, palpitations and leg swelling.  Gastrointestinal: Negative for abdominal pain, nausea and vomiting.  Genitourinary: Negative for decreased urine volume and hematuria.  Musculoskeletal: Positive for back pain.  Neurological: Negative for dizziness, seizures, weakness and headaches.  Psychiatric/Behavioral: Positive for sleep disturbance. Negative for confusion.      Current Outpatient Prescriptions on File Prior to Visit  Medication Sig Dispense Refill  . rivaroxaban (XARELTO) 20 MG TABS tablet Take 20 mg by mouth daily with supper.     No current facility-administered medications on file prior to visit.      Past Medical History:  Diagnosis Date  . Asthma   . Clotting disorder (HCC)   . COPD (chronic obstructive pulmonary disease) (HCC)   . Diabetes mellitus without complication (HCC)   . Emphysema of lung (HCC)   . GERD (gastroesophageal reflux disease)   . Hypertension   . PE (pulmonary embolism)    acute 08/2009  . Peripheral polyneuropathy (HCC) 06/25/2016  .  Pulmonary nodule    right  . Sleep apnea    No Known Allergies  Social History   Social History  . Marital status: Single    Spouse name: N/A  . Number of children: N/A  . Years of education: N/A   Social History Main Topics  . Smoking status: Current Every Day Smoker    Packs/day: 1.00    Years: 30.00    Types: Cigarettes  . Smokeless tobacco:  Never Used  . Alcohol use Yes     Comment: 1 mixed drink a night.  . Drug use: No  . Sexual activity: Yes   Other Topics Concern  . None   Social History Narrative  . None    Vitals:   09/06/16 1526  BP: 140/80  Pulse: 93  Resp: 12   O2 sat at RA 95%.  Body mass index is 34.86 kg/m.    Physical Exam  Nursing note and vitals reviewed. Constitutional: He is oriented to person, place, and time. He appears well-developed. No distress.  HENT:  Head: Atraumatic.  Mouth/Throat: Oropharynx is clear and moist and mucous membranes are normal. Abnormal dentition. Dental abscesses and dental caries present.    Mild gum edema and erythema, no drainage appreciated. + Tenderness with palpation.  Eyes: Conjunctivae and EOM are normal. Pupils are equal, round, and reactive to light.  Cardiovascular: Normal rate and regular rhythm.   No murmur heard. Pulses:      Dorsalis pedis pulses are 2+ on the right side, and 2+ on the left side.  Respiratory: Effort normal and breath sounds normal. No respiratory distress.  GI: Soft. He exhibits no mass. There is no hepatomegaly. There is no tenderness.  Musculoskeletal: He exhibits no edema or tenderness.  Lymphadenopathy:    He has no cervical adenopathy.  Neurological: He is alert and oriented to person, place, and time. He has normal strength. Coordination and gait normal.  Skin: Skin is warm. No erythema.  Psychiatric: He has a normal mood and affect. Cognition and memory are normal.  Well groomed, good eye contact.   Diabetic foot exam: 06/25/16.  ASSESSMENT AND PLAN:     Dustin RuizJohn was seen today for follow-up.  Diagnoses and all orders for this visit:    COPD, mild (HCC)  Improved but still having nocturnal wheezing almost daily. Symbicort increased to 160-4.5 mcg bid. Continue Albuterol inh as needed. Strongly recommended smoking cessation. F/U in 4 weeks.   -     budesonide-formoterol (SYMBICORT) 160-4.5 MCG/ACT  inhaler; Inhale 2 puffs into the lungs 2 (two) times daily. -     albuterol (PROVENTIL HFA;VENTOLIN HFA) 108 (90 Base) MCG/ACT inhaler; Inhale 2 puffs into the lungs every 6 (six) hours as needed for wheezing or shortness of breath.  Gastroesophageal reflux disease, esophagitis presence not specified  GERD precautions discussed. Cough at night could be related to this problem. Recommend trial of Omeprazole 40 mg for 4 weeks. F/U in 4 weeks.  -     omeprazole (PRILOSEC) 40 MG capsule; Take 1 capsule (40 mg total) by mouth daily before breakfast.  DM (diabetes mellitus), type 2 with neurological complications (HCC)  HgA1C was at goal, 6.3 in 06/2016. Continue non pharmacologic treatment, new recommendations will be given according to lab results. Regular exercise and healthy diet with avoidance of added sugar food intake is an important part of treatment and recommended. Annual eye exam, periodic dental and foot care recommended. He is due for eye exam. F/U in 5-6  months.    -     Hemoglobin A1c -     Microalbumin / creatinine urine ratio -     Basic metabolic panel  Peripheral polyneuropathy (HCC)  He is reporting not improvement of symptoms with Cymbalta and Gabapentin 600 mg. I recommend increasing dose of Cymbalta from 30 mg to 60 mg, no changes in Gabapentin. He states that he will need pain medication just at night, so I agree with Rx for Hydrocodone-Acetaminophen but lower dose, 5-325 mg at bedtime.  I will re-evaluate in 4 weeks. He understands side effects of medications. We will sign medication contract next OV if we decide to continue pain management.   -     DULoxetine (CYMBALTA) 60 MG capsule; Take 1 capsule (60 mg total) by mouth daily. -     HYDROcodone-acetaminophen (NORCO/VICODIN) 5-325 MG tablet; Take 1 tablet by mouth at bedtime as needed for moderate pain.  Cough  Explained that medication he is requesting is also a controlled medication and not appropriate  for chronic use. We discussed possible causes of cough: COPD and GERD among some. COPD treatment adjusted and PPI added today. F/U in 4 weeks.  Dental abscess  Abx treatment started. I explained that he must arranged appt with dentist, educated abut possible complications. Instructed about warning signs.   -     amoxicillin (AMOXIL) 500 MG capsule; Take 1 capsule (500 mg total) by mouth 3 (three) times daily.       -Mr. [name] was advised to return sooner than planned today if new concerns arise.       Randen Kauth G. [place], MD  [facility]. [place] office.

## 2016-09-06 NOTE — Patient Instructions (Addendum)
A few things to remember from today's visit:   COPD, mild (HCC) - Plan: budesonide-formoterol (SYMBICORT) 160-4.5 MCG/ACT inhaler, albuterol (PROVENTIL HFA;VENTOLIN HFA) 108 (90 Base) MCG/ACT inhaler  Gastroesophageal reflux disease, esophagitis presence not specified - Plan: omeprazole (PRILOSEC) 40 MG capsule  DM (diabetes mellitus), type 2 with neurological complications (HCC) - Plan: Hemoglobin A1c, Microalbumin / creatinine urine ratio, Basic metabolic panel  Peripheral polyneuropathy (HCC) - Plan: DULoxetine (CYMBALTA) 60 MG capsule, HYDROcodone-acetaminophen (NORCO/VICODIN) 5-325 MG tablet  Cough  Dental abscess - Plan: amoxicillin (AMOXIL) 500 MG capsule  TODAY:  Cymbalta increased from 30 mg to 60 mg. Omeprazole added for GERD ? Causing cough. Hydrocodone Rx given to take at bedtime. If not helping we need referral t pain management.  Symbicort increased.  Please try to quit smoking.  Arrange appointment with dentists. Eye exam is also needed.    Avoid foods that make your symptoms worse, for example coffee, chocolate,pepermeint,alcohol, and greasy food. Raising the head of your bed about 6 inches may help with nocturnal symptoms.  Avoid tobacco use. Weight loss (if you are overweight). Avoid lying down for 3 hours after eating.  Instead 3 large meals daily try small and more frequent meals during the day.   You should be evaluated immediately if bloody vomiting, bloody stools, black stools (like tar), difficulty swallowing, food gets stuck on the way down or choking when eating. Abnormal weight loss or severe abdominal pain.  If symptoms are not resolved sometimes endoscopy is necessary.   Please be sure medication list is accurate. If a new problem present, please set up appointment sooner than planned today.

## 2016-09-24 ENCOUNTER — Other Ambulatory Visit: Payer: Self-pay

## 2016-09-24 DIAGNOSIS — J449 Chronic obstructive pulmonary disease, unspecified: Secondary | ICD-10-CM

## 2016-09-24 MED ORDER — BUDESONIDE-FORMOTEROL FUMARATE 160-4.5 MCG/ACT IN AERO: 2.0000 | INHALATION_SPRAY | Freq: Two times a day (BID) | RESPIRATORY_TRACT | 3 refills | Status: DC

## 2016-10-05 ENCOUNTER — Ambulatory Visit: Payer: 59 | Admitting: Family Medicine

## 2017-05-21 DIAGNOSIS — E785 Hyperlipidemia, unspecified: Secondary | ICD-10-CM | POA: Insufficient documentation

## 2017-08-28 IMAGING — CT CT ANGIO CHEST
2 of 6 series · 18 of 36 positions shown · IV contrast (Omni 300)
Comparison: E CT 08/13/2009

CLINICAL DATA: Patient with shortness of breath.  History of DVT.

EXAM:
CT ANGIOGRAPHY CHEST WITH CONTRAST
TECHNIQUE: Multidetector CT imaging of the chest was performed using the
standard protocol during bolus administration of intravenous
contrast. Multiplanar CT image reconstructions and MIPs were
obtained to evaluate the vascular anatomy.
CONTRAST:  100mL OMNIPAQUE IOHEXOL 350 MG/ML SOLN

[Series 6: pe thins · axial · 0.78mm/px · z∈[+1533,+1805]mm · 17 of 601 slices shown]
[im 29/601  lung]
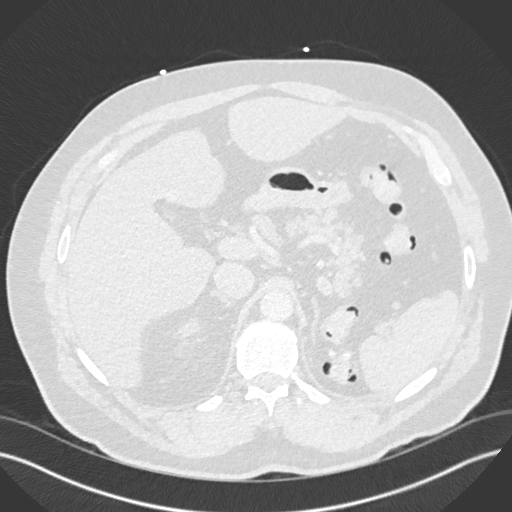
[im 58/601  mediastinal]
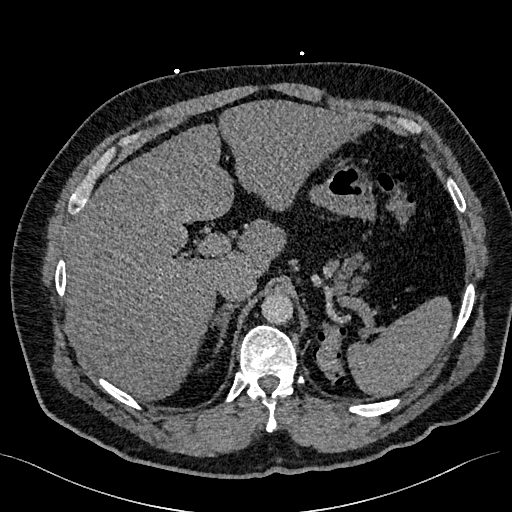
[im 86/601  lung]
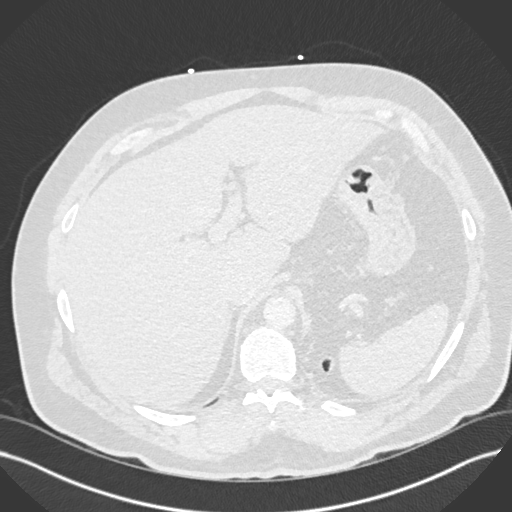
[im 143/601  mediastinal]
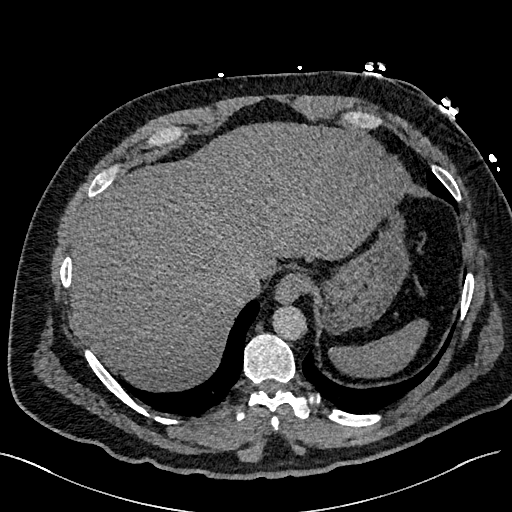
[im 172/601  lung]
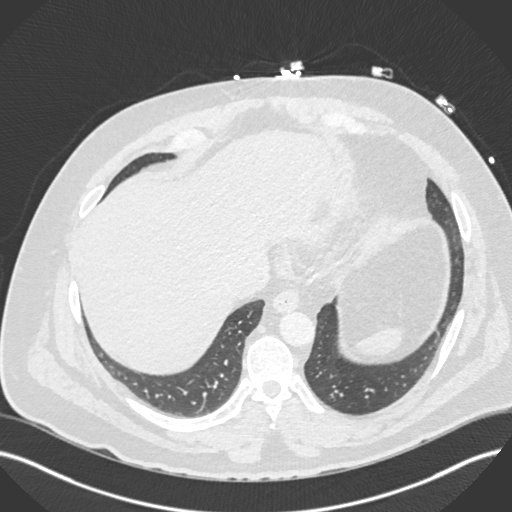
[im 201/601  mediastinal]
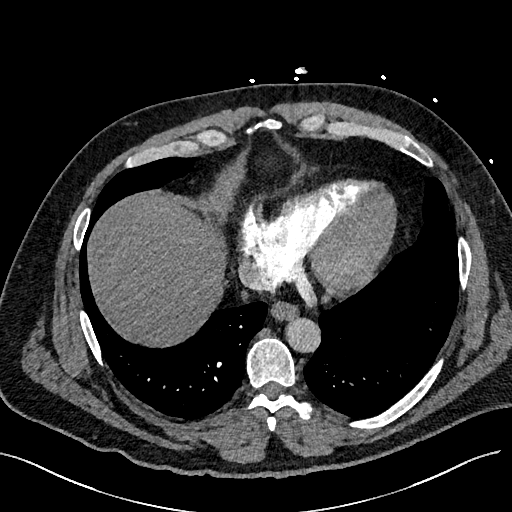
[im 229/601  lung]
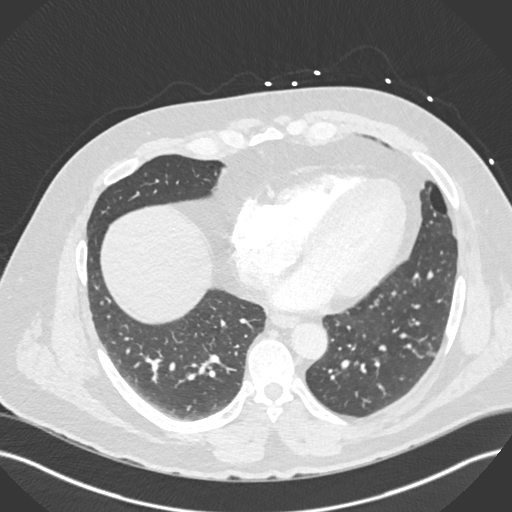
[im 258/601  mediastinal]
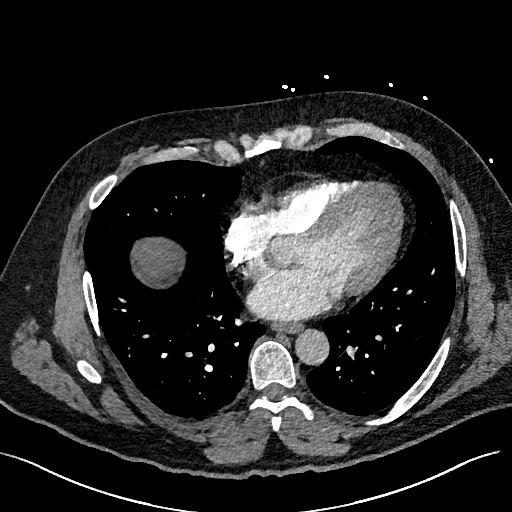
[im 315/601  lung]
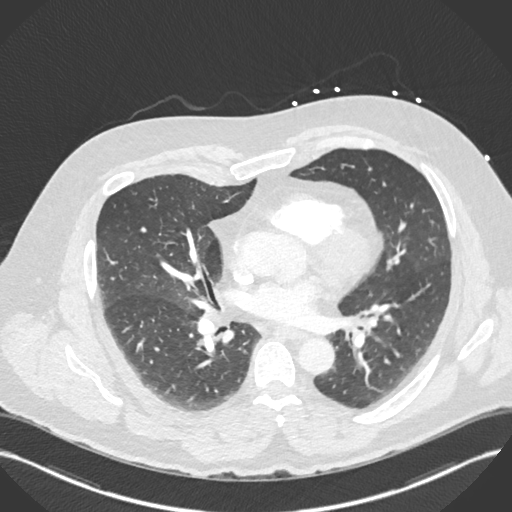
[im 343/601  mediastinal]
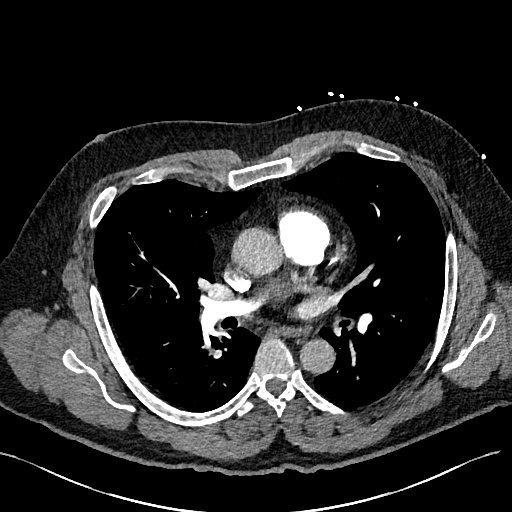
[im 372/601  lung]
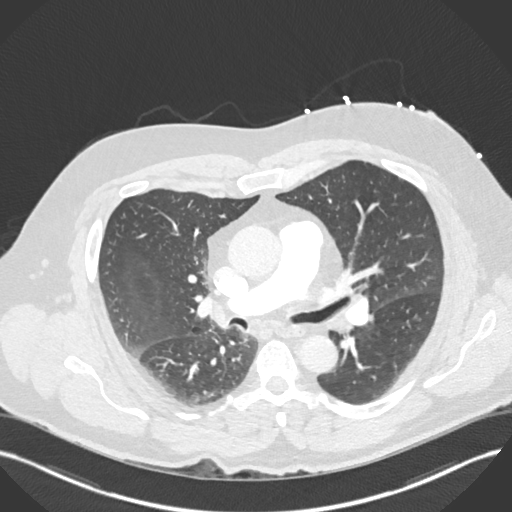
[im 401/601  mediastinal]
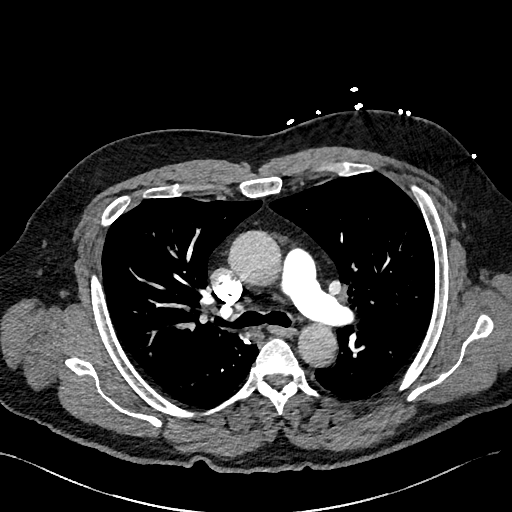
[im 429/601  lung]
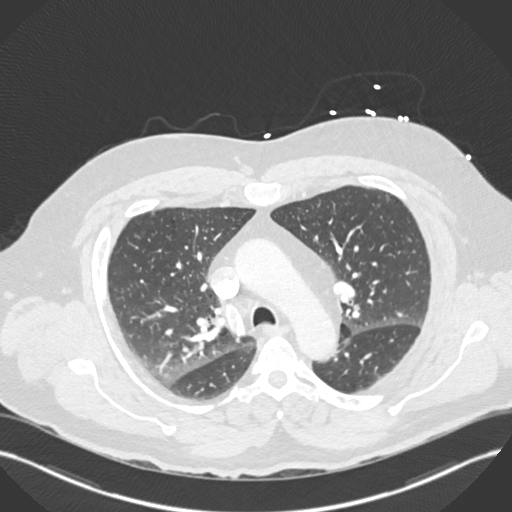
[im 458/601  mediastinal]
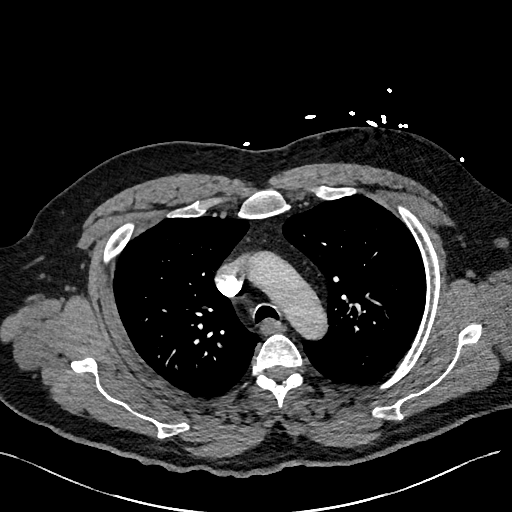
[im 515/601  lung]
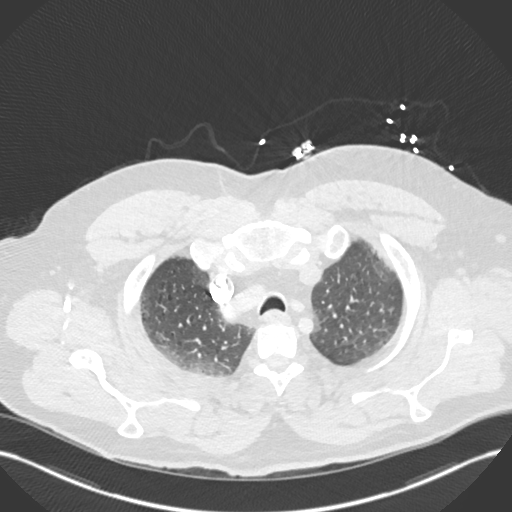
[im 543/601  mediastinal]
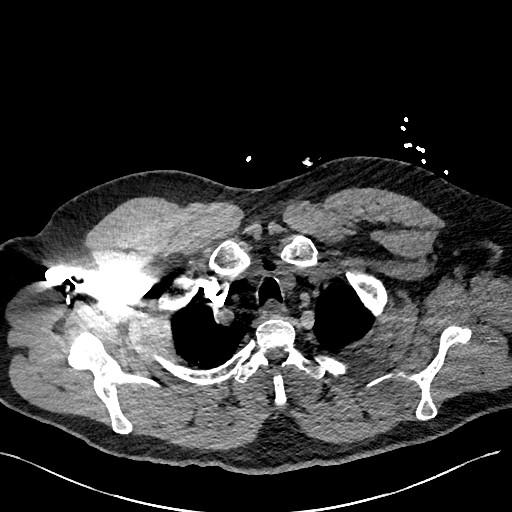
[im 572/601  lung]
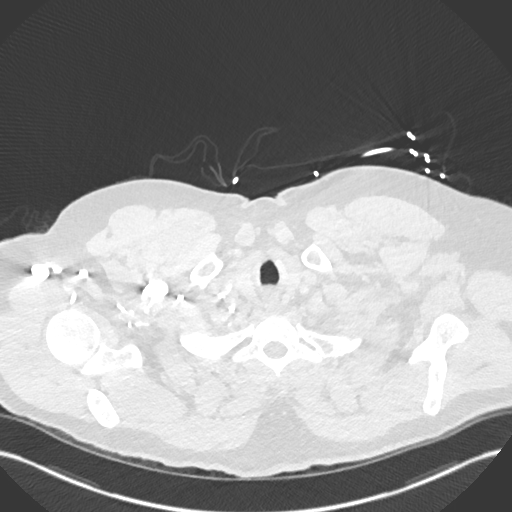

[Series 7: pe 2mm cor · coronal · 0.59mm/px · 1 of 172 slices shown]
[im 86/172  mediastinal]
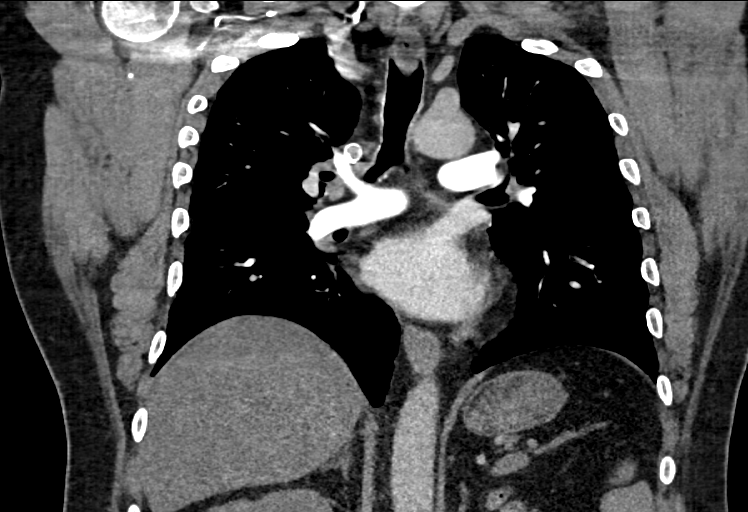

[18 of 36 positions shown; findings below may reference images not displayed]

FINDINGS: Mediastinum/Nodes: Heart is normal in size. No pericardial effusion.
Aorta and main pulmonary artery normal in caliber. No axillary,
mediastinal or hilar lymphadenopathy.

Adequate opacification the pulmonary arterial system. No evidence
for pulmonary embolism.

Lungs/Pleura: Expiratory phase images. No large area of pulmonary
consolidation. Small bulla within the lingula. Biapical paraseptal
emphysematous change. No pleural effusion or pneumothorax. 4 mm
right middle lobe pulmonary nodule (image 280; series 6) is stable
dating back to 6555, compatible with benign etiology.

Upper abdomen: Liver is diffusely low in attenuation compatible with
steatosis. Fatty sparing adjacent to the gallbladder fossa. The left
kidney is not visualized.

Musculoskeletal: No aggressive or acute appearing osseous lesions.

Review of the MIP images confirms the above findings.
IMPRESSION: No evidence for pulmonary embolism.

No acute process within the chest.

The left kidney is not visualized. Recommend correlation for
possibility of prior surgical removal. Otherwise, further evaluation
with abdominal/renal ultrasound could be performed.

Hepatic steatosis.

## 2017-09-23 DIAGNOSIS — R739 Hyperglycemia, unspecified: Secondary | ICD-10-CM | POA: Insufficient documentation

## 2017-12-12 ENCOUNTER — Other Ambulatory Visit: Payer: Self-pay | Admitting: Emergency Medicine

## 2017-12-12 DIAGNOSIS — N5089 Other specified disorders of the male genital organs: Secondary | ICD-10-CM

## 2017-12-16 ENCOUNTER — Ambulatory Visit
Admission: RE | Admit: 2017-12-16 | Discharge: 2017-12-16 | Disposition: A | Discharge status: Home / Self Care | Payer: Worker's Compensation | Source: Ambulatory Visit | Attending: Emergency Medicine | Admitting: Emergency Medicine

## 2017-12-16 DIAGNOSIS — N5089 Other specified disorders of the male genital organs: Secondary | ICD-10-CM

## 2018-06-24 ENCOUNTER — Telehealth: Payer: Self-pay | Admitting: Family Medicine

## 2018-06-24 NOTE — Telephone Encounter (Signed)
Copied from CRM #[id number]. Topic: Quick Communication - See Telephone Encounter °>> Jun 24, 2018  3:20 PM Dawoud, Jessica L wrote: °CRM for notification. See Telephone encounter for: 06/24/18. Pt called an stated that he would like to transfer care from betty jordan to cory nafziger. Please advise Cb#[phone] °

## 2018-06-24 NOTE — Telephone Encounter (Signed)
Fine with me. Thanks, BJ 

## 2018-06-24 NOTE — Telephone Encounter (Signed)
Copied from CRM [id number]. Topic: Quick Communication - See Telephone Encounter >> Jun 24, 2018  3:20 PM Luanna Cole wrote: CRM for notification. See Telephone encounter for: 06/24/18. Pt called an stated that he would like to transfer care from betty [place] to [employer]. Please advise [phone]

## 2018-06-25 NOTE — Telephone Encounter (Signed)
I am fine with that.   I do see he has a prescription for Norco pain medication, I will not be able to prescribe that for him

## 2018-06-25 NOTE — Telephone Encounter (Signed)
Patient is aware and states he is no longer taking pain medication.  30 minute Appointment made for 07/11/2018.

## 2018-07-11 ENCOUNTER — Ambulatory Visit: Payer: 59 | Admitting: Adult Health

## 2018-07-11 ENCOUNTER — Encounter: Payer: Self-pay | Admitting: Adult Health

## 2018-07-11 VITALS — BP 130/80 | HR 88 | Temp 97.6°F | Ht 73.0 in | Wt 287.0 lb

## 2018-07-11 DIAGNOSIS — G629 Polyneuropathy, unspecified: Secondary | ICD-10-CM

## 2018-07-11 DIAGNOSIS — Z7689 Persons encountering health services in other specified circumstances: Secondary | ICD-10-CM

## 2018-07-11 DIAGNOSIS — Z720 Tobacco use: Secondary | ICD-10-CM

## 2018-07-11 DIAGNOSIS — Z6835 Body mass index (BMI) 35.0-35.9, adult: Secondary | ICD-10-CM

## 2018-07-11 DIAGNOSIS — K219 Gastro-esophageal reflux disease without esophagitis: Secondary | ICD-10-CM

## 2018-07-11 DIAGNOSIS — E1149 Type 2 diabetes mellitus with other diabetic neurological complication: Secondary | ICD-10-CM | POA: Diagnosis not present

## 2018-07-11 DIAGNOSIS — J449 Chronic obstructive pulmonary disease, unspecified: Secondary | ICD-10-CM

## 2018-07-11 DIAGNOSIS — Z86718 Personal history of other venous thrombosis and embolism: Secondary | ICD-10-CM

## 2018-07-11 DIAGNOSIS — G2581 Restless legs syndrome: Secondary | ICD-10-CM

## 2018-07-11 DIAGNOSIS — I10 Essential (primary) hypertension: Secondary | ICD-10-CM

## 2018-07-11 MED ORDER — VARENICLINE TARTRATE 0.5 MG X 11 & 1 MG X 42 PO MISC
ORAL | 0 refills | Status: DC
Start: 2018-07-11 — End: 2018-09-12

## 2018-07-11 MED ORDER — ROPINIROLE HCL 0.5 MG PO TABS: 0.5000 mg | ORAL_TABLET | Freq: Every day | ORAL | 1 refills | Status: DC

## 2018-07-11 MED ORDER — PREGABALIN 150 MG PO CAPS: 150.0000 mg | ORAL_CAPSULE | Freq: Two times a day (BID) | ORAL | 1 refills | Status: DC

## 2018-07-11 NOTE — Progress Notes (Signed)
Patient presents to clinic today to establish care. He is a pleasant 57 year old male who  has a past medical history of Asthma, Clotting disorder (HCC), COPD (chronic obstructive pulmonary disease) (HCC), Diabetes mellitus without complication (HCC), Emphysema of lung (HCC), GERD (gastroesophageal reflux disease), Hypertension, MRSA (methicillin resistant staph aureus) culture positive, PE (pulmonary embolism), Peripheral polyneuropathy (06/25/2016), Pulmonary nodule, and Sleep apnea.  He has not had a CPE in " a long time".   Acute Concerns: Establish Care  Chronic Issues:  Essential Hypertension - Currently prescribed Losartan 25 mg   BP Readings from Last 3 Encounters:  07/11/18 130/80  09/06/16 140/80  06/25/16 (!) 144/80   COPD ( Emphysema) - Uses Spirivia and Albuterol inhaler PRN. Intermittent wheezing and cough, exertional dyspnea with activities.  Denies associated diaphoresis or chest pain. He wears oxygen 2 L  at night. Has not been seen by pulmonary in the past.   Tobacco Use- smokes about 1 pack a day. He has never tried quitting in the past. He is interested in quitting.   GERD- controlled with Prilosec   Peripheral Neuropathy - takes Lyrica 75 mg BID. Reports that this works for his pain for the most part but continues to have neuropathy pain   Pre diabetic - does not take any medication. Last A1c was 6.4 in Jan 2019   Restless leg syndrome - takes Requip 0.25 mg, does not feel as though his restless legs is adequately controlled.    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   Health Maintenance: Dental --  Not routine care  Vision -- Routine  Immunizations -- Not UTD  Colonoscopy -- Has not had in the past. He will check with his insurance     Past Medical History:  Diagnosis Date  . Asthma   . Clotting disorder (HCC)   . COPD (chronic obstructive pulmonary disease) (HCC)   . Diabetes mellitus without complication (HCC)   .  Emphysema of lung (HCC)   . GERD (gastroesophageal reflux disease)   . Hypertension   . MRSA (methicillin resistant staph aureus) culture positive   . PE (pulmonary embolism)    acute 08/2009  . Peripheral polyneuropathy 06/25/2016  . Pulmonary nodule    right  . Sleep apnea     No past surgical history on file.  Current Outpatient Medications on File Prior to Visit  Medication Sig Dispense Refill  . albuterol (PROVENTIL HFA;VENTOLIN HFA) 108 (90 Base) MCG/ACT inhaler Inhale 2 puffs into the lungs every 6 (six) hours as needed for wheezing or shortness of breath. 1 Inhaler 1  . glucose blood test strip Use to test blood sugar daily. 100 each 2  . losartan (COZAAR) 25 MG tablet TAKE 1 TABLET BY MOUTH EVERY DAY    . omeprazole (PRILOSEC) 40 MG capsule Take 1 capsule (40 mg total) by mouth daily before breakfast. 30 capsule 0  . rivaroxaban (XARELTO) 20 MG TABS tablet Take 20 mg by mouth daily with supper.    . rivaroxaban (XARELTO) 20 MG TABS tablet TAKE 1 TABLET BY MOUTH EVERY DAY    . tiotropium (SPIRIVA) 18 MCG inhalation capsule Place into inhaler and inhale.     No current facility-administered medications on file prior to visit.     No Known Allergies  Family History  Problem Relation Age of Onset  . Diabetes Mother   . Colon cancer Father     Social History  Socioeconomic History  . Marital status: Single    Spouse name: Not on file  . Number of children: Not on file  . Years of education: Not on file  . Highest education level: Not on file  Occupational History  . Not on file  Social Needs  . Financial resource strain: Not on file  . Food insecurity:    Worry: Not on file    Inability: Not on file  . Transportation needs:    Medical: Not on file    Non-medical: Not on file  Tobacco Use  . Smoking status: Current Every Day Smoker    Packs/day: 1.00    Years: 30.00    Pack years: 30.00    Types: Cigarettes  . Smokeless tobacco: Never Used  Substance  and Sexual Activity  . Alcohol use: Yes    Comment: 1 mixed drink a night.  . Drug use: No  . Sexual activity: Yes  Lifestyle  . Physical activity:    Days per week: Not on file    Minutes per session: Not on file  . Stress: Not on file  Relationships  . Social connections:    Talks on phone: Not on file    Gets together: Not on file    Attends religious service: Not on file    Active member of club or organization: Not on file    Attends meetings of clubs or organizations: Not on file    Relationship status: Not on file  . Intimate partner violence:    Fear of current or ex partner: Not on file    Emotionally abused: Not on file    Physically abused: Not on file    Forced sexual activity: Not on file  Other Topics Concern  . Not on file  Social History Narrative  . Not on file    Review of Systems  Constitutional: Negative.   HENT: Negative.   Respiratory: Positive for shortness of breath and wheezing.   Cardiovascular: Negative.   Gastrointestinal: Negative.   Genitourinary: Negative.   Musculoskeletal: Negative.   Skin: Negative.   Neurological: Positive for sensory change.  Endo/Heme/Allergies: Negative.   Psychiatric/Behavioral: Negative.   All other systems reviewed and are negative.      BP 130/80 (BP Location: Left Arm, Patient Position: Sitting, Cuff Size: Large)   Pulse 88   Temp 97.6 F (36.4 C) (Oral)   Ht 6\' 1"  (1.854 m)   Wt 287 lb (130.2 kg)   SpO2 96%   BMI 37.87 kg/m   Physical Exam  Constitutional: He is oriented to person, place, and time. He appears well-developed and well-nourished. No distress.  Obese   HENT:  Mouth/Throat: Abnormal dentition.  Cardiovascular: Normal rate, regular rhythm, normal heart sounds and intact distal pulses.  Pulmonary/Chest: Effort normal. No stridor. No respiratory distress. He has wheezes (trace wheezing throughout). He has no rales. He exhibits no tenderness.  Musculoskeletal: Normal range of motion.  He exhibits no edema, tenderness or deformity.  Neurological: He is alert and oriented to person, place, and time.  Skin: Skin is warm and dry. He is not diaphoretic.  Psychiatric: He has a normal mood and affect. His behavior is normal. Thought content normal.  Nursing note and vitals reviewed.    Assessment/Plan: 1. Encounter to establish care - Follow up in one month for CPE  - Follow up sooner if needed - Needs to work on diet and exercise   2. DM (diabetes mellitus), type 2 with  neurological complications (HCC) - Will check A1c at CPE in one month   3. BMI 35.0-35.9,adult - needs to work on weight loss through diet and exercise   4. Peripheral polyneuropathy - Will increase lyrica to 150 mg BID - pregabalin (LYRICA) 150 MG capsule; Take 1 capsule (150 mg total) by mouth 2 (two) times daily.  Dispense: 180 capsule; Refill: 1  5. Gastroesophageal reflux disease, esophagitis presence not specified - Continue with prilosec   6. COPD, mild (HCC) - Consider pulmonary consult in the future  - tiotropium (SPIRIVA) 18 MCG inhalation capsule; Place into inhaler and inhale.  7. Essential hypertension - No change at this time. Will continue to monitor  - losartan (COZAAR) 25 MG tablet; TAKE 1 TABLET BY MOUTH EVERY DAY  8. Tobacco use - Discussed options. Decided on Chantix.  - varenicline (CHANTIX STARTING MONTH PAK) 0.5 MG X 11 & 1 MG X 42 tablet; Take one 0.5 mg tablet by mouth once daily for 3 days, then increase to one 0.5 mg tablet twice daily for 4 days, then increase to one 1 mg tablet twice daily.  Dispense: 53 tablet; Refill: 0 Trial of chantix. Common side effects including rare risk of suicide ideation was discussed with the patient today.  Patient is instructed to go directly to the ED if this occurs.  We discussed that patient can continue to smoke for 1 week after starting chantix, but then must discontinue cigarettes.  He is also instructed to contact us prior to  completion of the starter month pack for an rx for the continuation month pack.  5 minutes spent with patient today on tobacco cessation counseling.   9. History of DVT (deep vein thrombosis)  - rivaroxaban (XARELTO) 20 MG TABS tablet; TAKE 1 TABLET BY MOUTH EVERY DAY  10. Restless legs - Will increase Requip from 0.25 mg to 0.5 mg  - rOPINIRole (REQUIP) 0.5 MG tablet; Take 1 tablet (0.5 mg total) by mouth at bedtime.  Dispense: 90 tablet; Refill: 1  Wellsite geologist

## 2018-07-11 NOTE — Patient Instructions (Addendum)
It was great meeting you today   Congratulations on wanting to quit smoking. I have prescribed you Chantix, please take this as directed   I have increased Lyrica to 150 mg twice a day   I have increase Requip to 0.5 mg at night   Please follow up in about one month for your physical   If you need anything, please let me know

## 2018-08-15 ENCOUNTER — Ambulatory Visit: Payer: 59 | Admitting: Adult Health

## 2018-08-29 ENCOUNTER — Ambulatory Visit: Payer: 59 | Admitting: Adult Health

## 2018-09-12 ENCOUNTER — Ambulatory Visit: Payer: 59 | Admitting: Adult Health

## 2018-09-12 ENCOUNTER — Encounter: Payer: Self-pay | Admitting: Adult Health

## 2018-09-12 VITALS — BP 130/80 | Temp 98.2°F | Ht 72.0 in | Wt 285.0 lb

## 2018-09-12 DIAGNOSIS — G629 Polyneuropathy, unspecified: Secondary | ICD-10-CM | POA: Diagnosis not present

## 2018-09-12 DIAGNOSIS — I10 Essential (primary) hypertension: Secondary | ICD-10-CM | POA: Diagnosis not present

## 2018-09-12 DIAGNOSIS — Z125 Encounter for screening for malignant neoplasm of prostate: Secondary | ICD-10-CM

## 2018-09-12 DIAGNOSIS — J449 Chronic obstructive pulmonary disease, unspecified: Secondary | ICD-10-CM | POA: Diagnosis not present

## 2018-09-12 DIAGNOSIS — Z7901 Long term (current) use of anticoagulants: Secondary | ICD-10-CM

## 2018-09-12 DIAGNOSIS — Z0000 Encounter for general adult medical examination without abnormal findings: Secondary | ICD-10-CM

## 2018-09-12 DIAGNOSIS — R7303 Prediabetes: Secondary | ICD-10-CM

## 2018-09-12 DIAGNOSIS — Z720 Tobacco use: Secondary | ICD-10-CM

## 2018-09-12 LAB — CBC WITH DIFFERENTIAL/PLATELET
BASOS PCT: 0.6 % (ref 0.0–3.0)
Basophils Absolute: 0.1 10*3/uL (ref 0.0–0.1)
EOS PCT: 2.8 % (ref 0.0–5.0)
Eosinophils Absolute: 0.2 10*3/uL (ref 0.0–0.7)
HCT: 47.4 % (ref 39.0–52.0)
HEMOGLOBIN: 16 g/dL (ref 13.0–17.0)
LYMPHS ABS: 1.8 10*3/uL (ref 0.7–4.0)
Lymphocytes Relative: 20.6 % (ref 12.0–46.0)
MCHC: 33.8 g/dL (ref 30.0–36.0)
MCV: 95.9 fl (ref 78.0–100.0)
MONO ABS: 0.8 10*3/uL (ref 0.1–1.0)
Monocytes Relative: 9.1 % (ref 3.0–12.0)
Neutro Abs: 5.8 10*3/uL (ref 1.4–7.7)
Neutrophils Relative %: 66.9 % (ref 43.0–77.0)
Platelets: 232 10*3/uL (ref 150.0–400.0)
RBC: 4.94 Mil/uL (ref 4.22–5.81)
RDW: 13.9 % (ref 11.5–15.5)
WBC: 8.7 10*3/uL (ref 4.0–10.5)

## 2018-09-12 LAB — BASIC METABOLIC PANEL
BUN: 19 mg/dL (ref 6–23)
CHLORIDE: 103 meq/L (ref 96–112)
CO2: 31 mEq/L (ref 19–32)
Calcium: 9.2 mg/dL (ref 8.4–10.5)
Creatinine, Ser: 1.28 mg/dL (ref 0.40–1.50)
GFR: 61.44 mL/min (ref >60.00)
GLUCOSE: 112 mg/dL — AB (ref 70–99)
Potassium: 4.3 mEq/L (ref 3.5–5.1)
Sodium: 141 mEq/L (ref 135–145)

## 2018-09-12 LAB — TSH: TSH: 1.62 u[IU]/mL (ref 0.35–4.50)

## 2018-09-12 LAB — COMPREHENSIVE METABOLIC PANEL
ALT: 29 U/L (ref 0–53)
AST: 16 U/L (ref 0–37)
Albumin: 4 g/dL (ref 3.5–5.2)
Alkaline Phosphatase: 87 U/L (ref 39–117)
BILIRUBIN TOTAL: 0.7 mg/dL (ref 0.2–1.2)
BUN: 19 mg/dL (ref 6–23)
CO2: 31 mEq/L (ref 19–32)
CREATININE: 1.28 mg/dL (ref 0.40–1.50)
Calcium: 9.2 mg/dL (ref 8.4–10.5)
Chloride: 103 mEq/L (ref 96–112)
GFR: 61.44 mL/min (ref >60.00)
Glucose, Bld: 112 mg/dL — ABNORMAL HIGH (ref 70–99)
Potassium: 4.3 mEq/L (ref 3.5–5.1)
SODIUM: 141 meq/L (ref 135–145)
Total Protein: 6.7 g/dL (ref 6.0–8.3)

## 2018-09-12 LAB — LIPID PANEL
Cholesterol: 184 mg/dL (ref 0–200)
HDL: 54.1 mg/dL (ref >39.00)
LDL Cholesterol: 107 mg/dL — ABNORMAL HIGH (ref 0–99)
NonHDL: 130.1
Total CHOL/HDL Ratio: 3
Triglycerides: 115 mg/dL (ref 0.0–149.0)
VLDL: 23 mg/dL (ref 0.0–40.0)

## 2018-09-12 LAB — PSA: PSA: 0.34 ng/mL (ref 0.10–4.00)

## 2018-09-12 LAB — HEMOGLOBIN A1C: Hgb A1c MFr Bld: 7 % — ABNORMAL HIGH (ref 4.6–6.5)

## 2018-09-12 MED ORDER — TIOTROPIUM BROMIDE MONOHYDRATE 18 MCG IN CAPS: 18.0000 ug | ORAL_CAPSULE | Freq: Every day | RESPIRATORY_TRACT | 3 refills | Status: DC

## 2018-09-12 MED ORDER — VARENICLINE TARTRATE 0.5 MG X 11 & 1 MG X 42 PO MISC: ORAL | 0 refills | Status: DC

## 2018-09-12 MED ORDER — ALBUTEROL SULFATE HFA 108 (90 BASE) MCG/ACT IN AERS: 2.0000 | INHALATION_SPRAY | Freq: Four times a day (QID) | RESPIRATORY_TRACT | 1 refills | Status: DC | PRN

## 2018-09-12 MED ORDER — PREGABALIN 300 MG PO CAPS: 300.0000 mg | ORAL_CAPSULE | Freq: Two times a day (BID) | ORAL | 0 refills | Status: DC

## 2018-09-12 NOTE — Progress Notes (Signed)
Subjective:    Patient ID: Dustin Koch, male    DOB: 1960/11/01, 58 y.o.   MRN: [id number]  HPI   Patient presents for yearly preventative medicine examination. He is a pleasant 58 year old male who  has a past medical history of Asthma, Clotting disorder (HCC), COPD (chronic obstructive pulmonary disease) (HCC), Diabetes mellitus without complication (HCC), Emphysema of lung (HCC), GERD (gastroesophageal reflux disease), Hypertension, MRSA (methicillin resistant staph aureus) culture positive, PE (pulmonary embolism), Peripheral polyneuropathy (06/25/2016), Pulmonary nodule, and Sleep apnea.  Essential Hypertension - Currently prescribed Losartan 25 mg - stable  BP Readings from Last 3 Encounters:  09/12/18 130/80  07/11/18 130/80  09/06/16 140/80    COPD ( Emphysema) - Uses Spirivia and Albuterol inhaler PRN. Intermittent wheezing and cough, exertional dyspnea with activities.  Denies associated diaphoresis or chest pain. He wears oxygen 2 L  at night. Has not been seen by pulmonary in the past- stable.   Tobacco Use- smokes about 1 pack a day. He has tried gum, patches and lozenges in the past but was unable to quit successfully. He was prescribed chantix during his last visit but reports that " insurance needed more information". We did not received a prior authorization.   GERD- Takes Prilosec - controlled.   Peripheral Neuropathy - We had increased Lyrica from 75 mg to 150 mg during his last visit. He did not find much benefit from this   Pre diabetic - does not take any medication. 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    All immunizations and health maintenance protocols were reviewed with the patient and needed orders were placed. Refuses all vaccinations   Appropriate screening laboratory values were ordered for the patient including screening of  hyperlipidemia, renal function and hepatic function. If indicated by BPH, a PSA was ordered.  Medication reconciliation,  past medical history, social history, problem list and allergies were reviewed in detail with the patient  Goals were established with regard to weight loss, exercise, and  diet in compliance with medications Wt Readings from Last 3 Encounters:  09/12/18 285 lb (129.3 kg)  07/11/18 287 lb (130.2 kg)  09/06/16 264 lb 4 oz (119.9 kg)    Due for colonoscopy but wants to hold off until he has his urological procedure of hydrocele.    Review of Systems  Constitutional: Negative.   HENT: Negative.   Eyes: Negative.   Respiratory: Positive for shortness of breath and wheezing.   Cardiovascular: Negative.   Gastrointestinal: Negative.   Endocrine: Negative.   Genitourinary: Positive for scrotal swelling and testicular pain.  Musculoskeletal: Positive for arthralgias and back pain.  Neurological: Positive for numbness.  Hematological: Negative.   Psychiatric/Behavioral: Negative.   All other systems reviewed and are negative.  Past Medical History:  Diagnosis Date  . Asthma   . Clotting disorder (HCC)   . COPD (chronic obstructive pulmonary disease) (HCC)   . Diabetes mellitus without complication (HCC)   . Emphysema of lung (HCC)   . GERD (gastroesophageal reflux disease)   . Hypertension   . MRSA (methicillin resistant staph aureus) culture positive   . PE (pulmonary embolism)    acute 08/2009  . Peripheral polyneuropathy 06/25/2016  . Pulmonary nodule    right  . Sleep apnea     Social History   Socioeconomic  History  . Marital status: Single    Spouse name: Not on file  . Number of children: Not on file  . Years of education: Not on file  . Highest education level: Not on file  Occupational History  . Not on file  Social Needs  . Financial resource strain: Not on file  . Food insecurity:    Worry: Not on file    Inability: Not on file  .  Transportation needs:    Medical: Not on file    Non-medical: Not on file  Tobacco Use  . Smoking status: Current Every Day Smoker    Packs/day: 1.00    Years: 30.00    Pack years: 30.00    Types: Cigarettes  . Smokeless tobacco: Never Used  Substance and Sexual Activity  . Alcohol use: Yes    Comment: 1 mixed drink a night.  . Drug use: No  . Sexual activity: Yes  Lifestyle  . Physical activity:    Days per week: Not on file    Minutes per session: Not on file  . Stress: Not on file  Relationships  . Social connections:    Talks on phone: Not on file    Gets together: Not on file    Attends religious service: Not on file    Active member of club or organization: Not on file    Attends meetings of clubs or organizations: Not on file    Relationship status: Not on file  . Intimate partner violence:    Fear of current or ex partner: Not on file    Emotionally abused: Not on file    Physically abused: Not on file    Forced sexual activity: Not on file  Other Topics Concern  . Not on file  Social History Narrative  . Not on file    History reviewed. No pertinent surgical history.  Family History  Problem Relation Age of Onset  . Diabetes Mother   . Colon cancer Father     No Known Allergies  Current Outpatient Medications on File Prior to Visit  Medication Sig Dispense Refill  . glucose blood test strip Use to test blood sugar daily. 100 each 2  . losartan (COZAAR) 25 MG tablet TAKE 1 TABLET BY MOUTH EVERY DAY    . omeprazole (PRILOSEC) 40 MG capsule Take 1 capsule (40 mg total) by mouth daily before breakfast. 30 capsule 0  . rivaroxaban (XARELTO) 20 MG TABS tablet Take 20 mg by mouth daily with supper.    . rivaroxaban (XARELTO) 20 MG TABS tablet TAKE 1 TABLET BY MOUTH EVERY DAY    . rOPINIRole (REQUIP) 0.5 MG tablet Take 1 tablet (0.5 mg total) by mouth at bedtime. 90 tablet 1   No current facility-administered medications on file prior to visit.     BP  130/80   Temp 98.2 F (36.8 C)   Ht 6' (1.829 m)   Wt 285 lb (129.3 kg)   BMI 38.65 kg/m       Objective:   Physical Exam Vitals signs and nursing note reviewed.  Constitutional:      Appearance: Normal appearance. He is obese.  HENT:     Head: Normocephalic.     Right Ear: Tympanic membrane, ear canal and external ear normal. There is no impacted cerumen.     Left Ear: Tympanic membrane, ear canal and external ear normal. There is no impacted cerumen.     Nose: Nose normal. No congestion or  rhinorrhea.     Mouth/Throat:     Mouth: Mucous membranes are moist.     Pharynx: Oropharynx is clear.  Eyes:     Extraocular Movements: Extraocular movements intact.     Conjunctiva/sclera: Conjunctivae normal.     Pupils: Pupils are equal, round, and reactive to light.  Neck:     Musculoskeletal: Normal range of motion and neck supple.  Cardiovascular:     Rate and Rhythm: Normal rate and regular rhythm.     Pulses: Normal pulses.     Heart sounds: Normal heart sounds.  Pulmonary:     Effort: Pulmonary effort is normal.     Breath sounds: No stridor. Wheezing present. No rhonchi or rales.  Abdominal:     General: Bowel sounds are normal.     Palpations: Abdomen is soft.  Genitourinary:    Scrotum/Testes:        Left: Testicular hydrocele present.     Comments: Will do psa  Musculoskeletal: Normal range of motion.  Skin:    General: Skin is warm and dry.     Capillary Refill: Capillary refill takes less than 2 seconds.     Coloration: Skin is not jaundiced or pale.     Findings: No bruising, erythema, lesion or rash.  Neurological:     General: No focal deficit present.     Mental Status: He is alert and oriented to person, place, and time.  Psychiatric:        Mood and Affect: Mood normal.        Behavior: Behavior normal.        Thought Content: Thought content normal.        Judgment: Judgment normal.        Assessment & Plan:  1. Routine general medical  examination at a health care facility - Needs to lose weight through diet and exercise - Encouraged to quit smoking  - Encouraged to let me know when he is ready for colonoscopy  - Basic metabolic panel - CBC with Differential/Platelet - Hemoglobin A1c - Comprehensive metabolic panel - Lipid panel - TSH  2. COPD, severe (HCC)  - albuterol (PROVENTIL HFA;VENTOLIN HFA) 108 (90 Base) MCG/ACT inhaler; Inhale 2 puffs into the lungs every 6 (six) hours as needed for wheezing or shortness of breath.  Dispense: 1 Inhaler; Refill: 1 - tiotropium (SPIRIVA) 18 MCG inhalation capsule; Place 1 capsule (18 mcg total) into inhaler and inhale daily.  Dispense: 90 capsule; Refill: 3 - Basic metabolic panel - CBC with Differential/Platelet - Hemoglobin A1c - Comprehensive metabolic panel - Lipid panel - TSH  3. Tobacco use - Needs to quit smoking. Will try getting chantix approved  - varenicline (CHANTIX STARTING MONTH PAK) 0.5 MG X 11 & 1 MG X 42 tablet; Take one 0.5 mg tablet by mouth once daily for 3 days, then increase to one 0.5 mg tablet twice daily for 4 days, then increase to one 1 mg tablet twice daily.  Dispense: 53 tablet; Refill: 0  4. Peripheral polyneuropathy - Increase Lyrica to 300 mg BID. Advised follow up if no improvement  - pregabalin (LYRICA) 300 MG capsule; Take 1 capsule (300 mg total) by mouth 2 (two) times daily.  Dispense: 180 capsule; Refill: 0  5. Essential hypertension - No change in medications  - Basic metabolic panel - CBC with Differential/Platelet - Hemoglobin A1c - Comprehensive metabolic panel - Lipid panel - TSH  6. Chronic anticoagulation - Continue with Xeralto   7. Prostate  cancer screening  - PSA  8. Pre-diabetes - Consider Metformin  - CBC with Differential/Platelet - Hemoglobin A1c - Comprehensive metabolic panel - Lipid panel - TSH   Shirline Freesory [employer], NP

## 2018-09-17 ENCOUNTER — Other Ambulatory Visit: Payer: Self-pay

## 2018-09-17 ENCOUNTER — Other Ambulatory Visit: Payer: Self-pay | Admitting: Family Medicine

## 2018-09-17 MED ORDER — METFORMIN HCL 500 MG PO TABS: 500.0000 mg | ORAL_TABLET | Freq: Two times a day (BID) | ORAL | 0 refills | Status: DC

## 2018-09-17 NOTE — Telephone Encounter (Signed)
Rx filled for 3 months.  Nothing further needed.

## 2018-12-11 ENCOUNTER — Other Ambulatory Visit: Payer: Self-pay | Admitting: Adult Health

## 2018-12-11 NOTE — Telephone Encounter (Signed)
[place] sent his refill to [facility]. Can we see where he is going to pick it up or if he wants it mail order

## 2018-12-19 ENCOUNTER — Other Ambulatory Visit: Payer: Self-pay

## 2018-12-19 ENCOUNTER — Encounter: Payer: Self-pay | Admitting: Adult Health

## 2018-12-19 ENCOUNTER — Ambulatory Visit (INDEPENDENT_AMBULATORY_CARE_PROVIDER_SITE_OTHER): Payer: 59 | Admitting: Adult Health

## 2018-12-19 DIAGNOSIS — K219 Gastro-esophageal reflux disease without esophagitis: Secondary | ICD-10-CM

## 2018-12-19 DIAGNOSIS — G2581 Restless legs syndrome: Secondary | ICD-10-CM

## 2018-12-19 DIAGNOSIS — J449 Chronic obstructive pulmonary disease, unspecified: Secondary | ICD-10-CM

## 2018-12-19 DIAGNOSIS — Z720 Tobacco use: Secondary | ICD-10-CM | POA: Diagnosis not present

## 2018-12-19 DIAGNOSIS — G629 Polyneuropathy, unspecified: Secondary | ICD-10-CM

## 2018-12-19 DIAGNOSIS — E1149 Type 2 diabetes mellitus with other diabetic neurological complication: Secondary | ICD-10-CM | POA: Diagnosis not present

## 2018-12-19 DIAGNOSIS — I10 Essential (primary) hypertension: Secondary | ICD-10-CM | POA: Diagnosis not present

## 2018-12-19 DIAGNOSIS — Z7901 Long term (current) use of anticoagulants: Secondary | ICD-10-CM

## 2018-12-19 MED ORDER — LOSARTAN POTASSIUM 25 MG PO TABS: 25.0000 mg | ORAL_TABLET | Freq: Every day | ORAL | 3 refills | Status: DC

## 2018-12-19 MED ORDER — RIVAROXABAN 20 MG PO TABS: 20.0000 mg | ORAL_TABLET | Freq: Every day | ORAL | 3 refills | Status: DC

## 2018-12-19 MED ORDER — OMEPRAZOLE 40 MG PO CPDR: 40.0000 mg | DELAYED_RELEASE_CAPSULE | Freq: Every day | ORAL | 3 refills | Status: DC

## 2018-12-19 MED ORDER — PREGABALIN 300 MG PO CAPS: 300.0000 mg | ORAL_CAPSULE | Freq: Two times a day (BID) | ORAL | 2 refills | Status: DC

## 2018-12-19 MED ORDER — ROPINIROLE HCL 0.5 MG PO TABS: 0.5000 mg | ORAL_TABLET | Freq: Every day | ORAL | 1 refills | Status: DC

## 2018-12-19 MED ORDER — ALBUTEROL SULFATE HFA 108 (90 BASE) MCG/ACT IN AERS: 2.0000 | INHALATION_SPRAY | Freq: Four times a day (QID) | RESPIRATORY_TRACT | 1 refills | Status: DC | PRN

## 2018-12-19 MED ORDER — METFORMIN HCL 500 MG PO TABS: 500.0000 mg | ORAL_TABLET | Freq: Two times a day (BID) | ORAL | 0 refills | Status: DC

## 2018-12-19 MED ORDER — GLUCOSE BLOOD VI STRP: ORAL_STRIP | 2 refills | Status: DC

## 2018-12-19 NOTE — Progress Notes (Signed)
Virtual Visit via Video Note  I connected with Dustin Koch on 12/19/18 at  8:00 AM EDT by a video enabled telemedicine application and verified that I am speaking with the correct person using two identifiers.  Location patient: home Location provider:work or home office Persons participating in the virtual visit: patient, provider  I discussed the limitations of evaluation and management by telemedicine and the availability of in person appointments. The patient expressed understanding and agreed to proceed.   HPI: 58 year old male who is being evaluated today for follow-up regarding many issues.  Tobacco Use -was prescribed a course of Chantix back in January 2020 during his CPE.  In the past he has tried gum, patches, lozenges but was unable to quit successfully.  At this time his smoking about 1 pack a day. He reports that he has not used Chantix yet. He has been weaning himself reports that he is cut back to about a quarter of a pack a day.  DM-had been "prediabetic" in the past, in January his A1c bumped up to 7.  He was started on metformin 500 mg BID in January 2020.  He reports today that he is taking the medication about once a day he is checking his blood sugars on a routine basis and continuously gets readings between 110 and 130.  He will come in and have an A1c done. Denies GI issues with Metformin   Peripheral Neuropathy-was increased from 150 mg twice daily to 300 mg twice daily due to no improvement in symptoms.  Reports that he has noticed some improvement in comfort in bilateral lower extremities since the increase.  He does need a refill of this medication  Hypertension -only prescribed Cozaar 25 mg daily.  Does not check his blood pressure at home on a routine basis.  He denies headaches, blurred vision, chest pain, shortness of breath BP Readings from Last 3 Encounters:  09/12/18 130/80  07/11/18 130/80  09/06/16 140/80   H/o DVT -currently prescribed Xarelto 20 mg every  day.  Needs a refill on this medication  He denies any acute issues   ROS: See pertinent positives and negatives per HPI.  Past Medical History:  Diagnosis Date  . Asthma   . Clotting disorder (HCC)   . COPD (chronic obstructive pulmonary disease) (HCC)   . Diabetes mellitus without complication (HCC)   . Emphysema of lung (HCC)   . GERD (gastroesophageal reflux disease)   . Hypertension   . MRSA (methicillin resistant staph aureus) culture positive   . PE (pulmonary embolism)    acute 08/2009  . Peripheral polyneuropathy 06/25/2016  . Pulmonary nodule    right  . Sleep apnea     No past surgical history on file.  Family History  Problem Relation Age of Onset  . Diabetes Mother   . Colon cancer Father       Current Outpatient Medications:  .  albuterol (VENTOLIN HFA) 108 (90 Base) MCG/ACT inhaler, Inhale 2 puffs into the lungs every 6 (six) hours as needed for wheezing or shortness of breath., Disp: 1 Inhaler, Rfl: 1 .  glucose blood test strip, Use to test blood sugar daily., Disp: 100 each, Rfl: 2 .  losartan (COZAAR) 25 MG tablet, Take 1 tablet (25 mg total) by mouth daily., Disp: 90 tablet, Rfl: 3 .  metFORMIN (GLUCOPHAGE) 500 MG tablet, Take 1 tablet (500 mg total) by mouth 2 (two) times daily with a meal., Disp: 180 tablet, Rfl: 0 .  omeprazole (PRILOSEC)  40 MG capsule, Take 1 capsule (40 mg total) by mouth daily before breakfast., Disp: 90 capsule, Rfl: 3 .  pregabalin (LYRICA) 300 MG capsule, Take 1 capsule (300 mg total) by mouth 2 (two) times daily., Disp: 180 capsule, Rfl: 2 .  rivaroxaban (XARELTO) 20 MG TABS tablet, Take 1 tablet (20 mg total) by mouth daily with supper., Disp: 90 tablet, Rfl: 3 .  rOPINIRole (REQUIP) 0.5 MG tablet, Take 1 tablet (0.5 mg total) by mouth at bedtime., Disp: 90 tablet, Rfl: 1 .  tiotropium (SPIRIVA) 18 MCG inhalation capsule, Place 1 capsule (18 mcg total) into inhaler and inhale daily., Disp: 90 capsule, Rfl: 3 .  varenicline  (CHANTIX STARTING MONTH PAK) 0.5 MG X 11 & 1 MG X 42 tablet, Take one 0.5 mg tablet by mouth once daily for 3 days, then increase to one 0.5 mg tablet twice daily for 4 days, then increase to one 1 mg tablet twice daily., Disp: 53 tablet, Rfl: 0  EXAM:  VITALS per patient if applicable:  GENERAL: alert, oriented, appears well and in no acute distress  HEENT: atraumatic, conjunttiva clear, no obvious abnormalities on inspection of external nose and ears  NECK: normal movements of the head and neck  LUNGS: on inspection no signs of respiratory distress, breathing rate appears normal, no obvious gross SOB, gasping or wheezing  CV: no obvious cyanosis  MS: moves all visible extremities without noticeable abnormality  PSYCH/NEURO: pleasant and cooperative, no obvious depression or anxiety, speech and thought processing grossly intact  ASSESSMENT AND PLAN:  Discussed the following assessment and plan:  Encourged to take metformin twice daily and work on lifestyle modifications.  He has done quite well with cutting back on smoking but needs to quit completely.  He was urged to use Chantix.  Tobacco use  DM (diabetes mellitus), type 2 with neurological complications (HCC) - Plan: glucose blood test strip, metFORMIN (GLUCOPHAGE) 500 MG tablet, Hemoglobin A1c, Basic Metabolic Panel, CANCELED: Hemoglobin A1c, CANCELED: Basic Metabolic Panel  Essential hypertension - Plan: losartan (COZAAR) 25 MG tablet  COPD, mild (HCC) - Plan: albuterol (VENTOLIN HFA) 108 (90 Base) MCG/ACT inhaler  Gastroesophageal reflux disease, esophagitis presence not specified - Plan: omeprazole (PRILOSEC) 40 MG capsule  Chronic anticoagulation - Plan: rivaroxaban (XARELTO) 20 MG TABS tablet  COPD, severe (HCC) - Plan: albuterol (VENTOLIN HFA) 108 (90 Base) MCG/ACT inhaler  Peripheral polyneuropathy - Plan: pregabalin (LYRICA) 300 MG capsule  Restless legs - Plan: rOPINIRole (REQUIP) 0.5 MG tablet     I  discussed the assessment and treatment plan with the patient. The patient was provided an opportunity to ask questions and all were answered. The patient agreed with the plan and demonstrated an understanding of the instructions.   The patient was advised to call back or seek an in-person evaluation if the symptoms worsen or if the condition fails to improve as anticipated.   Shirline Freesory Shardae Kleinman, NP

## 2019-02-13 ENCOUNTER — Telehealth: Payer: Self-pay | Admitting: Adult Health

## 2019-02-13 NOTE — Telephone Encounter (Signed)
See office noted from 07/2018

## 2019-02-13 NOTE — Telephone Encounter (Signed)
Copied from [place] [id number]. Topic: Quick Communication - Home Health Verbal Orders >> Feb 13, 2019  1:25 PM Erick Blinks wrote: Caller/Agency: [facility] Number: [phone] extension [phone] Requesting OT/PT/Skilled Nursing/Social Work/Speech Therapy: Office notes specific in regards to pt's oxygen needs. Fax: [phone] Please advise

## 2019-02-18 NOTE — Telephone Encounter (Signed)
Spoke to [place] and informed her that I have faxed over office notes and received confirmation that the fax was successful.  Nothing further needed.

## 2019-04-12 ENCOUNTER — Other Ambulatory Visit: Payer: Self-pay | Admitting: Adult Health

## 2019-04-12 DIAGNOSIS — E1149 Type 2 diabetes mellitus with other diabetic neurological complication: Secondary | ICD-10-CM

## 2019-04-15 NOTE — Telephone Encounter (Signed)
Medication denied.  Pt past due for appointment.  CRM created.

## 2019-06-05 ENCOUNTER — Other Ambulatory Visit: Payer: Self-pay

## 2019-06-05 ENCOUNTER — Encounter: Payer: Self-pay | Admitting: Podiatry

## 2019-06-05 ENCOUNTER — Ambulatory Visit: Payer: 59 | Admitting: Podiatry

## 2019-06-05 VITALS — BP 152/90 | HR 79 | Resp 16

## 2019-06-05 DIAGNOSIS — E1149 Type 2 diabetes mellitus with other diabetic neurological complication: Secondary | ICD-10-CM

## 2019-06-05 DIAGNOSIS — B351 Tinea unguium: Secondary | ICD-10-CM | POA: Diagnosis not present

## 2019-06-05 DIAGNOSIS — Z7901 Long term (current) use of anticoagulants: Secondary | ICD-10-CM

## 2019-06-05 DIAGNOSIS — M79674 Pain in right toe(s): Secondary | ICD-10-CM

## 2019-06-05 DIAGNOSIS — M79675 Pain in left toe(s): Secondary | ICD-10-CM

## 2019-06-05 MED ORDER — CICLOPIROX 8 % EX SOLN: Freq: Every day | CUTANEOUS | 2 refills | Status: DC

## 2019-06-05 NOTE — Patient Instructions (Signed)
Diabetes Mellitus and Foot Care Foot care is an important part of your health, especially when you have diabetes. Diabetes may cause you to have problems because of poor blood flow (circulation) to your feet and legs, which can cause your skin to:  Become thinner and drier.  Break more easily.  Heal more slowly.  Peel and crack. You may also have nerve damage (neuropathy) in your legs and feet, causing decreased feeling in them. This means that you may not notice minor injuries to your feet that could lead to more serious problems. Noticing and addressing any potential problems early is the best way to prevent future foot problems. How to care for your feet Foot hygiene  Wash your feet daily with warm water and mild soap. Do not use hot water. Then, pat your feet and the areas between your toes until they are completely dry. Do not soak your feet as this can dry your skin.  Trim your toenails straight across. Do not dig under them or around the cuticle. File the edges of your nails with an emery board or nail file.  Apply a moisturizing lotion or petroleum jelly to the skin on your feet and to dry, brittle toenails. Use lotion that does not contain alcohol and is unscented. Do not apply lotion between your toes. Shoes and socks  Wear clean socks or stockings every day. Make sure they are not too tight. Do not wear knee-high stockings since they may decrease blood flow to your legs.  Wear shoes that fit properly and have enough cushioning. Always look in your shoes before you put them on to be sure there are no objects inside.  To break in new shoes, wear them for just a few hours a day. This prevents injuries on your feet. Wounds, scrapes, corns, and calluses  Check your feet daily for blisters, cuts, bruises, sores, and redness. If you cannot see the bottom of your feet, use a mirror or ask someone for help.  Do not cut corns or calluses or try to remove them with medicine.  If you  find a minor scrape, cut, or break in the skin on your feet, keep it and the skin around it clean and dry. You may clean these areas with mild soap and water. Do not clean the area with peroxide, alcohol, or iodine.  If you have a wound, scrape, corn, or callus on your foot, look at it several times a day to make sure it is healing and not infected. Check for: ? Redness, swelling, or pain. ? Fluid or blood. ? Warmth. ? Pus or a bad smell. General instructions  Do not cross your legs. This may decrease blood flow to your feet.  Do not use heating pads or hot water bottles on your feet. They may burn your skin. If you have lost feeling in your feet or legs, you may not know this is happening until it is too late.  Protect your feet from hot and cold by wearing shoes, such as at the beach or on hot pavement.  Schedule a complete foot exam at least once a year (annually) or more often if you have foot problems. If you have foot problems, report any cuts, sores, or bruises to your health care provider immediately. Contact a health care provider if:  You have a medical condition that increases your risk of infection and you have any cuts, sores, or bruises on your feet.  You have an injury that is not   healing.  You have redness on your legs or feet.  You feel burning or tingling in your legs or feet.  You have pain or cramps in your legs and feet.  Your legs or feet are numb.  Your feet always feel cold.  You have pain around a toenail. Get help right away if:  You have a wound, scrape, corn, or callus on your foot and: ? You have pain, swelling, or redness that gets worse. ? You have fluid or blood coming from the wound, scrape, corn, or callus. ? Your wound, scrape, corn, or callus feels warm to the touch. ? You have pus or a bad smell coming from the wound, scrape, corn, or callus. ? You have a fever. ? You have a red line going up your leg. Summary  Check your feet every day  for cuts, sores, red spots, swelling, and blisters.  Moisturize feet and legs daily.  Wear shoes that fit properly and have enough cushioning.  If you have foot problems, report any cuts, sores, or bruises to your health care provider immediately.  Schedule a complete foot exam at least once a year (annually) or more often if you have foot problems. This information is not intended to replace advice given to you by your health care provider. Make sure you discuss any questions you have with your health care provider. Document Released: 08/17/2000 Document Revised: 10/02/2017 Document Reviewed: 09/21/2016 Elsevier Patient Education  2020 [employer]  

## 2019-06-09 ENCOUNTER — Other Ambulatory Visit: Payer: Self-pay | Admitting: Urology

## 2019-06-09 ENCOUNTER — Telehealth: Payer: Self-pay

## 2019-06-09 NOTE — Telephone Encounter (Signed)
That is fine 

## 2019-06-09 NOTE — Telephone Encounter (Signed)
Copied from [place] [id number]. Topic: General - Other >> Jun 09, 2019 11:34 AM Leward Quan A wrote: Reason for CRM: Pam with Dr Louis Meckel office called to request permission from [facility] to hold rivaroxaban (XARELTO) 20 MG TABS tablet for 72 hours. Patient will be having a Hydrocelectomy on 06/26/2019 please contact Pam at Ph# [phone] ext.# [phone]

## 2019-06-09 NOTE — Telephone Encounter (Signed)
Left a detailed message for Dustin Koch informing to hold Xarelto for 72 hours.  Advised a call back if any questions.

## 2019-06-11 ENCOUNTER — Other Ambulatory Visit: Payer: Self-pay | Admitting: Adult Health

## 2019-06-11 DIAGNOSIS — G2581 Restless legs syndrome: Secondary | ICD-10-CM

## 2019-06-12 NOTE — Telephone Encounter (Signed)
Sent to the pharmacy by e-scribe. 

## 2019-06-23 ENCOUNTER — Other Ambulatory Visit (HOSPITAL_COMMUNITY)
Admission: RE | Admit: 2019-06-23 | Discharge: 2019-06-23 | Disposition: A | Discharge status: Home / Self Care | Payer: 59 | Source: Ambulatory Visit | Attending: Urology | Admitting: Urology

## 2019-06-23 DIAGNOSIS — Z20828 Contact with and (suspected) exposure to other viral communicable diseases: Secondary | ICD-10-CM | POA: Insufficient documentation

## 2019-06-23 DIAGNOSIS — Z01812 Encounter for preprocedural laboratory examination: Secondary | ICD-10-CM | POA: Insufficient documentation

## 2019-06-24 LAB — NOVEL CORONAVIRUS, NAA (HOSP ORDER, SEND-OUT TO REF LAB; TAT 18-24 HRS): SARS-CoV-2, NAA: NOT DETECTED

## 2019-06-25 ENCOUNTER — Encounter (HOSPITAL_BASED_OUTPATIENT_CLINIC_OR_DEPARTMENT_OTHER): Payer: Self-pay | Admitting: *Deleted

## 2019-06-25 ENCOUNTER — Other Ambulatory Visit: Payer: Self-pay | Admitting: Urology

## 2019-06-25 ENCOUNTER — Other Ambulatory Visit: Payer: Self-pay

## 2019-06-25 NOTE — Progress Notes (Signed)
Subjective:   Patient ID: Dustin Koch, male   DOB: 58 y.o.   MRN: [id number]   HPI 58 year old male presents the office today for concerns of bilateral hallux, left third digit toenail becoming thickened discolored which is been ongoing for several years.  He says the left big toe becomes tender at times.  Denies any drainage or pus coming from the area.  No swelling.  He is diabetic and denies any history of ulceration he has no claudication symptoms.  His blood sugar he states typically runs from 125-150.  His A1c he reports was 7.  He is on Lyrica for neuropathy.   Review of Systems  All other systems reviewed and are negative.  Past Medical History:  Diagnosis Date  . Asthma   . Clotting disorder ([place])   . COPD (chronic obstructive pulmonary disease) ([place])   . Diabetes mellitus without complication ([place])   . Emphysema of lung ([place])   . GERD (gastroesophageal reflux disease)   . Hypertension   . MRSA (methicillin resistant staph aureus) culture positive   . Nicotine abuse 04/08/2013  . PE (pulmonary embolism)    acute 08/2009  . Peripheral polyneuropathy 06/25/2016  . Pulmonary nodule    right  . Sleep apnea     No past surgical history on file.   Current Outpatient Medications:  .  OXYGEN, Inhale into the lungs., Disp: , Rfl:  .  pregabalin (LYRICA) 300 MG capsule, Take 1 capsule (300 mg total) by mouth 2 (two) times daily., Disp: 180 capsule, Rfl: 2 .  tiotropium (SPIRIVA) 18 MCG inhalation capsule, Place 1 capsule (18 mcg total) into inhaler and inhale daily., Disp: 90 capsule, Rfl: 3 .  albuterol (VENTOLIN HFA) 108 (90 Base) MCG/ACT inhaler, Inhale 2 puffs into the lungs every 6 (six) hours as needed for wheezing or shortness of breath., Disp: 1 Inhaler, Rfl: 1 .  ciclopirox (PENLAC) 8 % solution, Apply topically at bedtime. Apply over nail and surrounding skin. Apply daily over previous coat. After seven (7) days, may remove with alcohol and continue cycle., Disp: 6.6 mL,  Rfl: 2 .  glucose blood test strip, Use to test blood sugar daily., Disp: 100 each, Rfl: 2 .  losartan (COZAAR) 25 MG tablet, Take 1 tablet (25 mg total) by mouth daily., Disp: 90 tablet, Rfl: 3 .  metFORMIN (GLUCOPHAGE) 500 MG tablet, Take 1 tablet (500 mg total) by mouth 2 (two) times daily with a meal., Disp: 180 tablet, Rfl: 0 .  rivaroxaban (XARELTO) 20 MG TABS tablet, Take 1 tablet (20 mg total) by mouth daily with supper., Disp: 90 tablet, Rfl: 3 .  rOPINIRole (REQUIP) 0.5 MG tablet, TAKE 1 TABLET BY MOUTH AT BEDTIME., Disp: 90 tablet, Rfl: 0  No Known Allergies       Objective:  Physical Exam  General: AAO x3, NAD  Dermatological: Nails are very hypertrophic, dystrophic, discolored with yellow-brown discoloration.  Mild tenderness palpation of the left hallux toenail the distal aspect there is no drainage or pus or clinical signs of infection.  There is no open lesions identified at this time.  Vascular: Dorsalis Pedis artery and Posterior Tibial artery pedal pulses are 2/4 bilateral with immedate capillary fill time. There is no pain with calf compression, swelling, warmth, erythema.   Neruologic: Sensation decreased with Semmes Weinstein monofilament.  Musculoskeletal:No pain, crepitus, or limitation noted with foot and ankle range of motion bilateral. Muscular strength 5/5 in all groups tested bilateral.  Gait: Unassisted, Nonantalgic.  Assessment:   58 year old male with symptomatic onychomycosis     Plan:  -Treatment options discussed including all alternatives, risks, and complications -Etiology of symptoms were discussed -Nails sharply debrided x10 without any complications or bleeding.  Discussed treatment options for nail fungus.  Also discussed nail avulsion of the left hallux that is painful but he wants to hold off on this today.  Also as he is diabetic and on blood thinners we will try to avoid this if possible.  We will continue to monitor.  Monitor for  any signs or symptoms of infection. -He was interested in inserts.  We will check orthotic coverage for him.  Vivi Barrack DPM

## 2019-06-25 NOTE — Progress Notes (Addendum)
Spoke w/ via phone for pre-op interview--- PT Lab needs dos----  Istat 8 and EKG COVID test ------ 06-23-2019  Arrive at ------- 5056 NPO after ------ [place] Medications to take morning of surgery ----- Lyrica w/ sips of water Diabetic medication ----- do not take metformin morning of surgery  Patient Special Instructions ----- asked to bring rescue inhaler with him dos Pre-Op special Istructions ----- pt on O2 via [place] only at night for sleep apnea.  Printed pcp clearance for xarelto in epic 06-09-2019 with chart  Patient verbalized understanding of instructions that were given at this phone interview. Patient denies cardiac s&s, shortness of breath, chest pain, fever, difficulty breathing, pt has smoker's cough a this phone interview.   Anesthesia :  PCP: Dorothyann Peng NP (lov epic 09-12-2018) Cardiologist : no Chest x-ray : 11-27-2017 care everywhere (epic Chest CTA 11-07-2015) EKG : 11-07-2015 epic Echo : no Stress test:  no Cardiac Cath :  No  Sleep Study/ CPAP : per pt never had a sleep study , stated oxygen level check during night while at hospital, it was low, that's when he started on nocturnal oxygen 2L via [place].  Pt stated he does use or need oxygen prn during the day  Fasting Blood Sugar :   120-135   / Checks Blood Sugar -- times a day:   Twice weekly  Blood Thinner/ Instructions /Last Dose: Xarelto/  Instructions given by dr Karsten Ro office whom received telephone clearance dated 06-09-2019 in epic to stop 72 hours priot to surgery/  Last dose 06-22-2019  ASA / Instructions/ Last Dose :   NO

## 2019-06-25 NOTE — H&P (Signed)
CC: Scrotal swelling  HPI: Dustin Koch is a 58 year-old male with a left hydrocele.  He first noticed his hydrocele 15 months ago. His hydrocele is on the left side. The patient has undergone a scrotal ultrasound.   He does have pain on the side of his hydrocele. His hydrocele does not cause restriction of normal activities. He is not currently having trouble urinating.   The patient has not been treated for a recent UTI. He has not had a testicular infection. He has not had injuries to the testicles or scrotum.   He has not had scrotal surgery. His hydrocele does bother him enough to consider surgical repair.   ~10cmx5cm left hydrocele sac on 4/19. Patient has noticed swelling and pain. The patient stated that this began several months ago when he was at work. He noted a tearing sensation when he was lifting something and then subsequently over the course of the next several months he developed worsening scrotal swelling.   Intv: Seen last July and did not f/u. Since the patient was seen he has noted worsening swelling. He is now having some pain and discomfort. He has sat on a several times.     ALLERGIES: None   MEDICATIONS: Metformin Hcl 500 mg tablet  Losartan Potassium 25 mg tablet  Lyrica  Pregabalin 300 mg capsule  Ropinirole Hcl 0.5 mg tablet  Spiriva 18 mcg capsule, with inhalation device  Xarelto     GU PSH: None   NON-GU PSH: None   GU PMH: Hydrocele - 03/18/2018      PMH Notes: Blood clot in lung   NON-GU PMH: Hypertension    FAMILY HISTORY: Deceased - Father   SOCIAL HISTORY: Marital Status: Married Preferred Language: English; Ethnicity: Not Hispanic Or Latino; Race: White Current Smoking Status: Patient smokes. Has smoked since 03/03/1978. Smokes 1 pack per day.   Tobacco Use Assessment Completed: Used Tobacco in last 30 days? Has never drank.  Drinks 2 caffeinated drinks per day.    REVIEW OF SYSTEMS:    GU Review Male:   Patient reports get up at  night to urinate. Patient denies frequent urination, trouble starting your stream, hard to postpone urination, have to strain to urinate , burning/ pain with urination, stream starts and stops, leakage of urine, penile pain, and erection problems.  Gastrointestinal (Upper):   Patient denies nausea, vomiting, and indigestion/ heartburn.  Gastrointestinal (Lower):   Patient reports diarrhea. Patient denies constipation.  Constitutional:   Patient denies fever, night sweats, weight loss, and fatigue.  Skin:   Patient denies skin rash/ lesion and itching.  Eyes:   Patient denies blurred vision and double vision.  Ears/ Nose/ Throat:   Patient denies sore throat and sinus problems.  Hematologic/Lymphatic:   Patient denies swollen glands and easy bruising.  Cardiovascular:   Patient reports leg swelling. Patient denies chest pains.  Respiratory:   Patient reports cough and shortness of breath.   Endocrine:   Patient denies excessive thirst.  Musculoskeletal:   Patient reports joint pain. Patient denies back pain.  Neurological:   Patient denies headaches and dizziness.  Psychologic:   Patient denies depression and anxiety.   Notes: Patient urinates 2-3x during the night.    VITAL SIGNS:    Weight 182 lb / 82.55 kg  Height 73 in / 185.42 cm  BP 137/87 mmHg  Heart Rate 73 /min  Temperature 98.6 F / 37 C  BMI 24.0 kg/m   GU PHYSICAL EXAMINATION:  Scrotum: Large left hemiscrotal swelling  Epididymides: Right: no spermatocele, no masses, no cysts, no tenderness, no induration, no enlargement. Left: no spermatocele, no masses, no cysts, no tenderness, no induration, no enlargement.  Testes: No tenderness, no swelling, no enlargement left testes. No tenderness, no swelling, no enlargement right testes. Normal location left testes. Normal location right testes. No mass, no cyst, no varicocele, no hydrocele left testes. No mass, no cyst, no varicocele, no hydrocele right testes.  Urethral Meatus:  Normal size. No lesion, no wart, no discharge, no polyp. Normal location.  Penis: Circumcised, no warts, no cracks. No dorsal Peyronie's plaques, no left corporal Peyronie's plaques, no right corporal Peyronie's plaques, no scarring, no warts. No balanitis, no meatal stenosis.   MULTI-SYSTEM PHYSICAL EXAMINATION:    Constitutional: Well-nourished. No physical deformities. Normally developed. Good grooming.  Neck: Neck symmetrical, not swollen. Normal tracheal position.  Respiratory: Expiratory wheeze in right lower lobe. Left CTA  Cardiovascular: Regular rate and rhythm. No murmur, no gallop. Normal temperature, normal extremity pulses, no swelling, no varicosities.   Lymphatic: No enlargement of neck, axillae, groin.  Skin: No paleness, no jaundice, no cyanosis. No lesion, no ulcer, no rash.  Neurologic / Psychiatric: Oriented to time, oriented to place, oriented to person. No depression, no anxiety, no agitation.  Gastrointestinal: No mass, no tenderness, no rigidity, non obese abdomen.  Eyes: Normal conjunctivae. Normal eyelids.  Ears, Nose, Mouth, and Throat: Left ear no scars, no lesions, no masses. Right ear no scars, no lesions, no masses. Nose no scars, no lesions, no masses. Normal hearing. Normal lips.  Musculoskeletal: Normal gait and station of head and neck.     PAST DATA REVIEWED:  Source Of History:  Patient  Records Review:   Previous Doctor Records, Previous Patient Records, POC Tool  X-Ray Review: Scrotal Ultrasound: Reviewed Films. Discussed With Patient.     PROCEDURES:          Urinalysis Dipstick Dipstick Cont'd  Color: Yellow Bilirubin: Neg mg/dL  Appearance: Clear Ketones: Neg mg/dL  Specific Gravity: 1.015 Blood: Neg ery/uL  pH: 6.5 Protein: Neg mg/dL  Glucose: Neg mg/dL Urobilinogen: 0.2 mg/dL    Nitrites: Neg    Leukocyte Esterase: Neg leu/uL    ASSESSMENT/PLAN:      ICD-10 Details  1 GU:   Hydrocele -  The patient has a left symptomatic hydrocele that  he would like treated. We discussed management options including aspiration and sclerosis versus hydrocelectomy. I recommended hydrocelectomy given the recurrence risk with aspiration. I discussed the surgery with the patient detail including the recovery. The patient understands that following the operation there could be a Penrose drain.   The patient has a history of diabetes and is a smoker, but he is otherwise in reasonable shape. He also has a history of DVT and takes Xarelto. He will need to stop this prior to his procedure. We discussed this.

## 2019-06-26 ENCOUNTER — Ambulatory Visit (HOSPITAL_BASED_OUTPATIENT_CLINIC_OR_DEPARTMENT_OTHER)
Admission: RE | Admit: 2019-06-26 | Discharge: 2019-06-26 | Disposition: A | Discharge status: Home / Self Care | Payer: 59 | Attending: Urology | Admitting: Urology

## 2019-06-26 ENCOUNTER — Encounter (HOSPITAL_BASED_OUTPATIENT_CLINIC_OR_DEPARTMENT_OTHER)
Admission: RE | Disposition: A | Discharge status: Home / Self Care | Payer: Self-pay | Source: Home / Self Care | Attending: Urology

## 2019-06-26 ENCOUNTER — Ambulatory Visit (HOSPITAL_BASED_OUTPATIENT_CLINIC_OR_DEPARTMENT_OTHER): Payer: 59 | Admitting: Anesthesiology

## 2019-06-26 ENCOUNTER — Encounter (HOSPITAL_BASED_OUTPATIENT_CLINIC_OR_DEPARTMENT_OTHER): Payer: Self-pay

## 2019-06-26 ENCOUNTER — Other Ambulatory Visit: Payer: Self-pay

## 2019-06-26 DIAGNOSIS — E119 Type 2 diabetes mellitus without complications: Secondary | ICD-10-CM | POA: Insufficient documentation

## 2019-06-26 DIAGNOSIS — F17210 Nicotine dependence, cigarettes, uncomplicated: Secondary | ICD-10-CM | POA: Insufficient documentation

## 2019-06-26 DIAGNOSIS — Z7984 Long term (current) use of oral hypoglycemic drugs: Secondary | ICD-10-CM | POA: Diagnosis not present

## 2019-06-26 DIAGNOSIS — Z7901 Long term (current) use of anticoagulants: Secondary | ICD-10-CM | POA: Insufficient documentation

## 2019-06-26 DIAGNOSIS — J449 Chronic obstructive pulmonary disease, unspecified: Secondary | ICD-10-CM | POA: Insufficient documentation

## 2019-06-26 DIAGNOSIS — G4730 Sleep apnea, unspecified: Secondary | ICD-10-CM | POA: Insufficient documentation

## 2019-06-26 DIAGNOSIS — I10 Essential (primary) hypertension: Secondary | ICD-10-CM | POA: Insufficient documentation

## 2019-06-26 DIAGNOSIS — Z79899 Other long term (current) drug therapy: Secondary | ICD-10-CM | POA: Diagnosis not present

## 2019-06-26 DIAGNOSIS — N433 Hydrocele, unspecified: Secondary | ICD-10-CM | POA: Diagnosis present

## 2019-06-26 HISTORY — DX: Long term (current) use of anticoagulants: Z79.01

## 2019-06-26 HISTORY — DX: Idiopathic sleep related nonobstructive alveolar hypoventilation: G47.34

## 2019-06-26 HISTORY — DX: Personal history of Methicillin resistant Staphylococcus aureus infection: Z86.14

## 2019-06-26 HISTORY — DX: Personal history of other venous thrombosis and embolism: Z86.718

## 2019-06-26 HISTORY — DX: Nocturia: R35.1

## 2019-06-26 HISTORY — DX: Hydrocele, unspecified: N43.3

## 2019-06-26 HISTORY — PX: HYDROCELE EXCISION: SHX482

## 2019-06-26 HISTORY — DX: Type 2 diabetes mellitus without complications: E11.9

## 2019-06-26 HISTORY — DX: Simple chronic bronchitis: J41.0

## 2019-06-26 HISTORY — DX: Presence of spectacles and contact lenses: Z97.3

## 2019-06-26 HISTORY — DX: Personal history of pulmonary embolism: Z86.711

## 2019-06-26 LAB — POCT I-STAT, CHEM 8
BUN: 22 mg/dL — ABNORMAL HIGH (ref 6–20)
Calcium, Ion: 1.23 mmol/L (ref 1.15–1.40)
Chloride: 105 mmol/L (ref 98–111)
Creatinine, Ser: 1 mg/dL (ref 0.61–1.24)
Glucose, Bld: 124 mg/dL — ABNORMAL HIGH (ref 70–99)
HCT: 46 % (ref 39.0–52.0)
Hemoglobin: 15.6 g/dL (ref 13.0–17.0)
Potassium: 5.2 mmol/L — ABNORMAL HIGH (ref 3.5–5.1)
Sodium: 141 mmol/L (ref 135–145)
TCO2: 27 mmol/L (ref 22–32)

## 2019-06-26 LAB — GLUCOSE, CAPILLARY: Glucose-Capillary: 137 mg/dL — ABNORMAL HIGH (ref 70–99)

## 2019-06-26 SURGERY — HYDROCELECTOMY
Anesthesia: General | Site: Scrotum | Laterality: Left

## 2019-06-26 MED ORDER — LIDOCAINE 2% (20 MG/ML) 5 ML SYRINGE
INTRAMUSCULAR | Status: DC | PRN
  Administered 2019-06-26: 100 mg via INTRAVENOUS

## 2019-06-26 MED ORDER — HYDROMORPHONE HCL 1 MG/ML IJ SOLN
0.2500 mg | INTRAMUSCULAR | Status: DC | PRN
  Filled 2019-06-26: qty 0.5

## 2019-06-26 MED ORDER — LACTATED RINGERS IV SOLN
INTRAVENOUS | Status: DC
  Administered 2019-06-26: 11:00:00 via INTRAVENOUS
  Filled 2019-06-26: qty 1000

## 2019-06-26 MED ORDER — PHENYLEPHRINE 40 MCG/ML (10ML) SYRINGE FOR IV PUSH (FOR BLOOD PRESSURE SUPPORT)
PREFILLED_SYRINGE | INTRAVENOUS | Status: DC | PRN
  Administered 2019-06-26 (×2): 120 ug via INTRAVENOUS
  Administered 2019-06-26 (×2): 80 ug via INTRAVENOUS

## 2019-06-26 MED ORDER — MIDAZOLAM HCL 2 MG/2ML IJ SOLN
INTRAMUSCULAR | Status: AC
  Filled 2019-06-26: qty 2

## 2019-06-26 MED ORDER — ONDANSETRON HCL 4 MG/2ML IJ SOLN
INTRAMUSCULAR | Status: AC
  Filled 2019-06-26: qty 2

## 2019-06-26 MED ORDER — LIDOCAINE 2% (20 MG/ML) 5 ML SYRINGE
INTRAMUSCULAR | Status: AC
  Filled 2019-06-26: qty 5

## 2019-06-26 MED ORDER — OXYCODONE HCL 5 MG PO TABS
5.0000 mg | ORAL_TABLET | Freq: Once | ORAL | Status: AC | PRN
  Administered 2019-06-26: 5 mg via ORAL
  Filled 2019-06-26: qty 1

## 2019-06-26 MED ORDER — MIDAZOLAM HCL 5 MG/5ML IJ SOLN
INTRAMUSCULAR | Status: DC | PRN
  Administered 2019-06-26: 2 mg via INTRAVENOUS

## 2019-06-26 MED ORDER — DEXTROSE 5 % IV SOLN
3.0000 g | INTRAVENOUS | Status: AC
  Administered 2019-06-26: 3 g via INTRAVENOUS
  Filled 2019-06-26: qty 3000

## 2019-06-26 MED ORDER — TRIPLE ANTIBIOTIC 3.5-400-5000 EX OINT
TOPICAL_OINTMENT | CUTANEOUS | Status: DC | PRN
  Administered 2019-06-26: 1 via TOPICAL

## 2019-06-26 MED ORDER — FENTANYL CITRATE (PF) 250 MCG/5ML IJ SOLN
INTRAMUSCULAR | Status: AC
  Filled 2019-06-26: qty 5

## 2019-06-26 MED ORDER — CEFAZOLIN SODIUM-DEXTROSE 1-4 GM/50ML-% IV SOLN
INTRAVENOUS | Status: AC
  Filled 2019-06-26: qty 50

## 2019-06-26 MED ORDER — CEFAZOLIN SODIUM-DEXTROSE 2-4 GM/100ML-% IV SOLN
INTRAVENOUS | Status: AC
  Filled 2019-06-26: qty 100

## 2019-06-26 MED ORDER — PROPOFOL 10 MG/ML IV BOLUS
INTRAVENOUS | Status: DC | PRN
  Administered 2019-06-26: 200 mg via INTRAVENOUS

## 2019-06-26 MED ORDER — ONDANSETRON HCL 4 MG/2ML IJ SOLN
INTRAMUSCULAR | Status: DC | PRN
  Administered 2019-06-26: 4 mg via INTRAVENOUS

## 2019-06-26 MED ORDER — CEFAZOLIN SODIUM-DEXTROSE 2-4 GM/100ML-% IV SOLN
2.0000 g | INTRAVENOUS | Status: DC
  Filled 2019-06-26: qty 100

## 2019-06-26 MED ORDER — PROMETHAZINE HCL 25 MG/ML IJ SOLN
6.2500 mg | INTRAMUSCULAR | Status: DC | PRN
  Filled 2019-06-26: qty 1

## 2019-06-26 MED ORDER — MEPERIDINE HCL 25 MG/ML IJ SOLN
6.2500 mg | INTRAMUSCULAR | Status: DC | PRN
  Filled 2019-06-26: qty 1

## 2019-06-26 MED ORDER — FENTANYL CITRATE (PF) 100 MCG/2ML IJ SOLN
INTRAMUSCULAR | Status: DC | PRN
  Administered 2019-06-26: 50 ug via INTRAVENOUS

## 2019-06-26 MED ORDER — PHENYLEPHRINE 40 MCG/ML (10ML) SYRINGE FOR IV PUSH (FOR BLOOD PRESSURE SUPPORT)
PREFILLED_SYRINGE | INTRAVENOUS | Status: AC
  Filled 2019-06-26: qty 10

## 2019-06-26 MED ORDER — BUPIVACAINE-EPINEPHRINE 0.5% -1:200000 IJ SOLN
INTRAMUSCULAR | Status: DC | PRN
  Administered 2019-06-26: 11 mL

## 2019-06-26 MED ORDER — OXYCODONE HCL 5 MG/5ML PO SOLN
5.0000 mg | Freq: Once | ORAL | Status: AC | PRN
  Filled 2019-06-26: qty 5

## 2019-06-26 MED ORDER — OXYCODONE HCL 5 MG PO TABS
ORAL_TABLET | ORAL | Status: AC
  Filled 2019-06-26: qty 1

## 2019-06-26 SURGICAL SUPPLY — 43 items
BLADE CLIPPER SENSICLIP SURGIC (BLADE) IMPLANT
BLADE SURG 15 STRL LF DISP TIS (BLADE) ×1 IMPLANT
BLADE SURG 15 STRL SS (BLADE) ×2
BNDG GAUZE ELAST 4 BULKY (GAUZE/BANDAGES/DRESSINGS) ×2 IMPLANT
CANISTER SUCT 3000ML PPV (MISCELLANEOUS) IMPLANT
CANISTER SUCTION 1200CC (MISCELLANEOUS) IMPLANT
CLEANER CAUTERY TIP 5X5 PAD (MISCELLANEOUS) IMPLANT
COVER BACK TABLE 60X90IN (DRAPES) ×2 IMPLANT
COVER MAYO STAND STRL (DRAPES) ×2 IMPLANT
COVER WAND RF STERILE (DRAPES) ×2 IMPLANT
DISSECTOR ROUND CHERRY 3/8 STR (MISCELLANEOUS) IMPLANT
DRAIN PENROSE 18X1/4 LTX STRL (WOUND CARE) ×1 IMPLANT
DRAPE LAPAROTOMY 100X72 PEDS (DRAPES) ×2 IMPLANT
ELECT REM PT RETURN 9FT ADLT (ELECTROSURGICAL)
ELECTRODE REM PT RTRN 9FT ADLT (ELECTROSURGICAL) IMPLANT
GAUZE SPONGE 4X4 12PLY STRL (GAUZE/BANDAGES/DRESSINGS) ×1 IMPLANT
GAUZE SPONGE 4X4 12PLY STRL LF (GAUZE/BANDAGES/DRESSINGS) ×1 IMPLANT
GLOVE BIO SURGEON STRL SZ8 (GLOVE) ×2 IMPLANT
GOWN STRL REUS W/ TWL LRG LVL3 (GOWN DISPOSABLE) ×1 IMPLANT
GOWN STRL REUS W/ TWL XL LVL3 (GOWN DISPOSABLE) ×1 IMPLANT
GOWN STRL REUS W/TWL LRG LVL3 (GOWN DISPOSABLE) ×2
GOWN STRL REUS W/TWL XL LVL3 (GOWN DISPOSABLE) ×2
KIT TURNOVER CYSTO (KITS) ×2 IMPLANT
NDL HYPO 25X1 1.5 SAFETY (NEEDLE) ×1 IMPLANT
NEEDLE HYPO 25X1 1.5 SAFETY (NEEDLE) ×2 IMPLANT
NS IRRIG 500ML POUR BTL (IV SOLUTION) ×2 IMPLANT
PACK BASIN DAY SURGERY FS (CUSTOM PROCEDURE TRAY) ×2 IMPLANT
PAD CLEANER CAUTERY TIP 5X5 (MISCELLANEOUS)
PENCIL BUTTON HOLSTER BLD 10FT (ELECTRODE) ×2 IMPLANT
SUPPORT SCROTAL LG STRP (MISCELLANEOUS) ×2 IMPLANT
SUT CHROMIC 3 0 SH 27 (SUTURE) ×4 IMPLANT
SUT ETHILON 3 0 PS 1 (SUTURE) ×1 IMPLANT
SUT SILK 4 0 TIES 17X18 (SUTURE) IMPLANT
SUT VIC AB 4-0 RB1 27 (SUTURE)
SUT VIC AB 4-0 RB1 27X BRD (SUTURE) IMPLANT
SUT VICRYL 6 0 RB 1 (SUTURE) IMPLANT
SYR CONTROL 10ML LL (SYRINGE) ×2 IMPLANT
TOWEL OR 17X26 10 PK STRL BLUE (TOWEL DISPOSABLE) ×2 IMPLANT
TRAY DSU PREP LF (CUSTOM PROCEDURE TRAY) ×2 IMPLANT
TUBE CONNECTING 12X1/4 (SUCTIONS) ×2 IMPLANT
WATER STERILE IRR 3000ML UROMA (IV SOLUTION) IMPLANT
WATER STERILE IRR 500ML POUR (IV SOLUTION) ×2 IMPLANT
YANKAUER SUCT BULB TIP NO VENT (SUCTIONS) ×2 IMPLANT

## 2019-06-26 NOTE — Op Note (Signed)
PATIENT:  Dustin Koch  PRE-OPERATIVE DIAGNOSIS:  POST-OPERATIVE DIAGNOSIS:  Same  PROCEDURE:  Procedure(s): Left hydrocelectomy  SURGEON:  Claybon Jabs  INDICATION: Mr. Sliwa is a 58 year old male patient with a symptomatic left hydrocele.  It has increased in size.  It causes discomfort with standing and at work.  He has had a previous ultrasound that revealed no testicular pathology.  He would like this treated and surgical correction was discussed and he is elected to proceed.  ANESTHESIA:  General  EBL:  Minimal  DRAINS: Quarter inch Penrose drain  LOCAL MEDICATIONS USED: Half percent Marcaine with epinephrine  SPECIMEN:  None  DISPOSITION OF SPECIMEN:  N/A  Description of procedure: After informed consent the patient was brought to the major OR, placed on the table and administered general anesthesia. His genitalia was then sterilely prepped and draped. An official timeout was then performed.  A midline median raphae scrotal incision was then made and carried down over the left hydrocele. The tissue over the hydrocele was cleared using a combination of sharp and blunt technique. The hydrocele was then opened, drained of clear amber fluid and delivered through the incision. The excess hydrocele sac tissue was excised with the Bovie electrocautery and then I cauterized the edges. I then reapproximated the edges posteriorly behind the epididymis with a running, locking 3-0 chromic suture. The appendix testis was removed with the Bovie.   His testicle was then replaced in the normal anatomic position and his left hemiscrotum.  A right angle clamp was then placed through the incision and positioned in the most dependent portion of his left hemiscrotum.  An incision was made over the right angle clamp which was passed through the skin and a Penrose drain was grasped and drawn into the left hemiscrotum and placed adjacent to the testicle.  It was then secured to the skin with a 3-0  nylon suture.  I then closed the deep scrotal tissue with running 3-0 chromic suture in a locking fashion. I injected half percent Marcaine in the subcutaneous tissue and closed the skin with running 3-0 chromic. Neosporin, a sterile gauze dressing, fluff Kerlix and a scrotal support were applied. The patient tolerated the procedure well no intraoperative complications. Needle sponge and instrument counts were correct at the end of the operation.   PLAN OF CARE: Discharge to home after PACU  PATIENT DISPOSITION:  PACU - hemodynamically stable.

## 2019-06-26 NOTE — Anesthesia Procedure Notes (Signed)
Procedure Name: LMA Insertion Date/Time: 06/26/2019 11:53 AM Performed by: Bonney Aid, CRNA Pre-anesthesia Checklist: Patient identified, Emergency Drugs available, Suction available and Patient being monitored Patient Re-evaluated:Patient Re-evaluated prior to induction Oxygen Delivery Method: Circle system utilized Preoxygenation: Pre-oxygenation with 100% oxygen Induction Type: IV induction Ventilation: Mask ventilation without difficulty LMA: LMA inserted LMA Size: 5.0 Number of attempts: 1 Airway Equipment and Method: Bite block Placement Confirmation: positive ETCO2 Tube secured with: Tape Dental Injury: Teeth and Oropharynx as per pre-operative assessment

## 2019-06-26 NOTE — Anesthesia Preprocedure Evaluation (Addendum)
Anesthesia Evaluation  Patient identified by MRN, date of birth, ID band Patient awake    Reviewed: Allergy & Precautions, NPO status , Patient's Chart, lab work & pertinent test results  Airway Mallampati: II  TM Distance: >3 FB Neck ROM: Full    Dental  (+) Dental Advisory Given, Poor Dentition, Missing, Teeth Intact   Pulmonary sleep apnea , COPD, Current Smoker,    Pulmonary exam normal breath sounds clear to auscultation       Cardiovascular hypertension, Normal cardiovascular exam Rhythm:Regular Rate:Normal     Neuro/Psych negative psych ROS   GI/Hepatic Neg liver ROS, GERD  ,  Endo/Other  diabetes  Renal/GU negative Renal ROS     Musculoskeletal negative musculoskeletal ROS (+)   Abdominal (+) + obese,   Peds  Hematology negative hematology ROS (+)   Anesthesia Other Findings   Reproductive/Obstetrics                            Anesthesia Physical Anesthesia Plan  ASA: III  Anesthesia Plan: General   Post-op Pain Management:    Induction: Intravenous  PONV Risk Score and Plan: 3 and Ondansetron, Dexamethasone, Treatment may vary due to age or medical condition and Midazolam  Airway Management Planned: LMA  Additional Equipment:   Intra-op Plan:   Post-operative Plan: Extubation in OR  Informed Consent: I have reviewed the patients History and Physical, chart, labs and discussed the procedure including the risks, benefits and alternatives for the proposed anesthesia with the patient or authorized representative who has indicated his/her understanding and acceptance.     Dental advisory given  Plan Discussed with: CRNA  Anesthesia Plan Comments:         Anesthesia Quick Evaluation

## 2019-06-26 NOTE — Discharge Instructions (Signed)
Scrotal surgery postoperative instructions ° °Wound: ° °In most cases your incision will have absorbable sutures that will dissolve within the first 10-20 days. Some will fall out even earlier. Expect some redness as the sutures dissolved but this should occur only around the sutures. If there is generalized redness, especially with increasing pain or swelling, let us know. The scrotum will very likely get "black and blue" as the blood in the tissues spread. Sometimes the whole scrotum will turn colors. The black and blue is followed by a yellow and brown color. In time, all the discoloration will go away. In some cases some firm swelling in the area of the testicle may persist for up to 4-6 weeks after the surgery and is considered normal in most cases. ° °Diet: ° °You may return to your normal diet within 24 hours following your surgery. You may note some mild nausea and possibly vomiting the first 6-8 hours following surgery. This is usually due to the side effects of anesthesia, and will disappear quite soon. I would suggest clear liquids and a very light meal the first evening following your surgery. ° °Activity: ° °Your physical activity should be restricted the first 48 hours. During that time you should remain relatively inactive, moving about only when necessary. During the first 7-10 days following surgery he should avoid lifting any heavy objects (anything greater than 15 pounds), and avoid strenuous exercise. If you work, ask us specifically about your restrictions, both for work and home. We will write a note to your employer if needed. ° °You should plan to wear a tight pair of jockey shorts or an athletic supporter for the first 4-5 days, even to sleep. This will keep the scrotum immobilized to some degree and keep the swelling down. ° °Ice packs should be placed on and off over the scrotum for the first 48 hours. Frozen peas or corn in a ZipLock bag can be frozen, used and re-frozen. Fifteen minutes  on and 15 minutes off is a reasonable schedule. The ice is a good pain reliever and keeps the swelling down. ° °Hygiene: ° °You may shower 48 hours after your surgery. Tub bathing should be restricted until the seventh day. ° ° ° ° ° ° ° ° ° °Medication: ° °You will be sent home with some type of pain medication. In many cases you will be sent home with a narcotic pain pill (hydrococone or oxycodone). If the pain is not too bad, you may take either Tylenol (acetaminophen) or Advil (ibuprofen) which contain no narcotic agents, and might be tolerated a little better, with fewer side effects. If the pain medication you are sent home with does not control the pain, you will have to let us know. Some narcotic pain medications cannot be given or refilled by a phone call to a pharmacy. ° °Problems you should report to us: ° °· Fever of 101.0 degrees Fahrenheit or greater. °· Moderate or severe swelling under the skin incision or involving the scrotum. °· Drug reaction such as hives, a rash, nausea or vomiting. °·  ° ° °Post Anesthesia Home Care Instructions ° °Activity: °Get plenty of rest for the remainder of the day. A responsible adult should stay with you for 24 hours following the procedure.  °For the next 24 hours, DO NOT: °-Drive a car °-Operate machinery °-Drink alcoholic beverages °-Take any medication unless instructed by your physician °-Make any legal decisions or sign important papers. ° °Meals: °Start with liquid foods such as   gelatin or soup. Progress to regular foods as tolerated. Avoid greasy, spicy, heavy foods. If nausea and/or vomiting occur, drink only clear liquids until the nausea and/or vomiting subsides. Call your physician if vomiting continues. ° °Special Instructions/Symptoms: °Your throat may feel dry or sore from the anesthesia or the breathing tube placed in your throat during surgery. If this causes discomfort, gargle with warm salt water. The discomfort should disappear within 24  hours. ° °If you had a scopolamine patch placed behind your ear for the management of post- operative nausea and/or vomiting: ° °1. The medication in the patch is effective for 72 hours, after which it should be removed.  Wrap patch in a tissue and discard in the trash. Wash hands thoroughly with soap and water. °2. You may remove the patch earlier than 72 hours if you experience unpleasant side effects which may include dry mouth, dizziness or visual disturbances. °3. Avoid touching the patch. Wash your hands with soap and water after contact with the patch. °  ° °

## 2019-06-26 NOTE — Transfer of Care (Signed)
Immediate Anesthesia Transfer of Care Note  Patient: Dustin Koch  Procedure(s) Performed: HYDROCELECTOMY ADULT (Left Scrotum)  Patient Location: PACU  Anesthesia Type:General  Level of Consciousness: awake, alert  and oriented  Airway & Oxygen Therapy: Patient Spontanous Breathing and Patient connected to nasal cannula oxygen  Post-op Assessment: Report given to RN and Post -op Vital signs reviewed and stable  Post vital signs: Reviewed and stable  Last Vitals: 125//80 Vitals Value Taken Time  BP    Temp    Pulse 73 06/26/19 1254  Resp 13 06/26/19 1254  SpO2 96 % 06/26/19 1254  Vitals shown include unvalidated device data.  Last Pain:  Vitals:   06/26/19 1032  TempSrc: Oral  PainSc: 0-No pain      Patients Stated Pain Goal: 6 (40/34/74 2595)  Complications: No apparent anesthesia complications

## 2019-06-29 ENCOUNTER — Encounter (HOSPITAL_BASED_OUTPATIENT_CLINIC_OR_DEPARTMENT_OTHER): Payer: Self-pay | Admitting: Urology

## 2019-06-29 NOTE — Anesthesia Postprocedure Evaluation (Signed)
Anesthesia Post Note  Patient: Dustin Koch  Procedure(s) Performed: HYDROCELECTOMY ADULT (Left Scrotum)     Patient location during evaluation: PACU Anesthesia Type: General Level of consciousness: sedated and patient cooperative Pain management: pain level controlled Vital Signs Assessment: post-procedure vital signs reviewed and stable Respiratory status: spontaneous breathing Cardiovascular status: stable Anesthetic complications: no    Last Vitals:  Vitals:   06/26/19 1330 06/26/19 1444  BP: 122/90 130/89  Pulse: 80 77  Resp: 13 14  Temp:  36.5 C  SpO2: 94% 92%    Last Pain:  Vitals:   06/26/19 1444  TempSrc:   PainSc: [place]

## 2019-08-03 ENCOUNTER — Telehealth: Payer: Self-pay | Admitting: Adult Health

## 2019-08-03 NOTE — Telephone Encounter (Signed)
Copied from [place] [id number]. Topic: General - Other >> Aug 03, 2019 10:16 AM Keene Breath wrote: Reason for CRM: Called to request that the nurse call him regarding an order he said was faxed over on Friday.  Please advise and call to discuss at [phone]

## 2019-08-05 NOTE — Telephone Encounter (Signed)
Order form for oxygen received and sent back as denied.  Pt has not been evaluated by Tommi Rumps for oxygen/need for.

## 2019-09-07 ENCOUNTER — Other Ambulatory Visit: Payer: Self-pay | Admitting: Adult Health

## 2019-09-07 DIAGNOSIS — G2581 Restless legs syndrome: Secondary | ICD-10-CM

## 2019-09-11 ENCOUNTER — Ambulatory Visit: Payer: 59 | Admitting: Podiatry

## 2019-09-16 ENCOUNTER — Telehealth (INDEPENDENT_AMBULATORY_CARE_PROVIDER_SITE_OTHER): Payer: 59 | Admitting: Family Medicine

## 2019-09-16 ENCOUNTER — Telehealth: Payer: Self-pay | Admitting: Adult Health

## 2019-09-16 ENCOUNTER — Other Ambulatory Visit: Payer: Self-pay

## 2019-09-16 DIAGNOSIS — J42 Unspecified chronic bronchitis: Secondary | ICD-10-CM | POA: Diagnosis not present

## 2019-09-16 DIAGNOSIS — R400 Somnolence: Secondary | ICD-10-CM | POA: Diagnosis not present

## 2019-09-16 DIAGNOSIS — M25562 Pain in left knee: Secondary | ICD-10-CM

## 2019-09-16 MED ORDER — BUDESONIDE-FORMOTEROL FUMARATE 160-4.5 MCG/ACT IN AERO: 2.0000 | INHALATION_SPRAY | Freq: Two times a day (BID) | RESPIRATORY_TRACT | 5 refills | Status: DC

## 2019-09-16 NOTE — Telephone Encounter (Signed)
Pt's spouse called in to switch providers . Pt doesn't care which provider, pt says that he would prefer a MD instead.   Please assist with transition if okay>

## 2019-09-16 NOTE — Telephone Encounter (Signed)
That is fine with me.

## 2019-09-16 NOTE — Telephone Encounter (Signed)
Okay for TOC 

## 2019-09-16 NOTE — Progress Notes (Signed)
This visit type was conducted due to national recommendations for restrictions regarding the COVID-19 pandemic in an effort to limit this patient's exposure and mitigate transmission in our community.   Virtual Visit via Telephone Note  I connected with Dustin Koch on 09/16/19 at  5:00 PM EST by telephone and verified that I am speaking with the correct person using two identifiers.   I discussed the limitations, risks, security and privacy concerns of performing an evaluation and management service by telephone and the availability of in person appointments. I also discussed with the patient that there may be a patient responsible charge related to this service. The patient expressed understanding and agreed to proceed.  Location patient: home Location provider: work or home office Participants present for the call: patient, provider Patient did not have a visit in the prior 7 days to address this/these issue(s).   History of Present Illness: Dustin Koch called for the following issues  He has reported history of COPD.  He apparently not had pulmonary function testing in past but has had evidence from previous imaging of likely COPD.  He currently takes Spiriva and rescue inhaler with Ventolin.  He states he has had couple weeks of mostly dry cough.  No fever.  No chills.  No body aches.  No significant nasal congestion.  He feels that his COPD has been poorly controlled for some time.  He does take Spiriva daily.  He denies any prior history of long-acting bronchodilators or inhaled steroids.  Long history of smoking.  He uses oxygen at night.  He has had overnight pulse oximetry but no pulmonary function testing  He did have recent Covid test per work last week and this was negative.  Frequent daytime somnolence.  Never studied for obstructive sleep apnea.  There has been suspicion of this previously.  Other issue is left knee pain for about a week.  Denies any injury.  He states his left knee  is slightly swollen.  He does have history of gout but has not noted any redness or warmth.  Worse with squatting and bending the knee.  He has past history of DVT but is currently on Xarelto and this pain is different.  No locking or giving way.  Past Medical History:  Diagnosis Date  . Anticoagulant long-term use    xarelto-- managed by pcp---  hx dvt's/ pe's  . COPD (chronic obstructive pulmonary disease) ([place])    followed by pcp--- (06-25-2019  per pt it's been several months since last exaberation, denies SOB, DOE, or difficulty breathing and stated does not use oxygen as needed for copd)  . Emphysema of lung ([place])   . 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 History of MRSA infection    per pt approx.  2014 abscess goin/ thigh area  . History of pulmonary embolus (PE)    12/ 2010  bilateral PE  . Hypertension    followed by pcp  . Left hydrocele   . Nocturia   . Nocturnal oxygen desaturation    per pt uses O2 @ 2L via [place] only  . Peripheral polyneuropathy   . Sleep apnea    06-25-2019 per pt has never had a sleep study , however stated while in hospital yrs ago his oxygen sat's was checked during the night and was low;  that when he was started on oxygen at night via [place]   .  Smokers' cough (HCC)    per pt occasionlly productive  . Type 2 diabetes mellitus (HCC)    followed by pcp---  pt states fasting cbg 120-135,  check's cbg twice weekly  . Wears glasses    Past Surgical History:  Procedure Laterality Date  . HYDROCELE EXCISION Left 06/26/2019   Procedure: HYDROCELECTOMY ADULT;  Surgeon: Ihor Gully, MD;  Location: [facility];  Service: Urology;  Laterality: Left;  . INCISION AND DRAINAGE ABSCESS  x2  2014  approx.    reports that he has been smoking cigarettes. He has a 32.00 pack-year smoking history. He has never used smokeless tobacco. He reports current alcohol use. He reports that he does not use  drugs. family history includes Colon cancer in his father; Diabetes in his mother. Not on File    Observations/Objective: Patient sounds cheerful and well on the phone. I do not appreciate any SOB. Speech and thought processing are grossly intact. Patient reported vitals:  Assessment and Plan:  #1 reported history of COPD.  This has been diagnosed clinically and also suspicion for COPD by imaging.  He is relating some chronic shortness of breath and intermittent wheezing  -Consider addition of Symbicort 160 mg 2 puffs twice daily and rinse mouth after use -We discussed possible pulmonary referral and pulmonary function testing but he declines at this time -Continue Spiriva daily and rescue inhaler as needed with Ventolin  #2 daytime somnolence.  Question of obstructive sleep apnea  -Discussed referral to pulmonary for further work-up and he declines.  He will consider though possibly in future.   #3 left knee pain.  Based on the fact that he has not had any redness or warmth does not sound likely related to gout.  Question meniscus related.  -Needs office follow-up for further evaluation if persists.  He will try giving this another couple or few weeks with some icing and avoiding squatting  Follow Up Instructions:  -He is looking at switching primary providers.  He is encouraged to set up complete physical soon   [id number] [id number] [id number] 11-20 [id number] 21-30 I did not refer this patient for an OV in the next 24 hours for this/these issue(s).  I discussed the assessment and treatment plan with the patient. The patient was provided an opportunity to ask questions and all were answered. The patient agreed with the plan and demonstrated an understanding of the instructions.   The patient was advised to call back or seek an in-person evaluation if the symptoms worsen or if the condition fails to improve as anticipated.  I provided 24 minutes of non-face-to-face time during this  encounter.   Evelena Peat, MD

## 2019-09-16 NOTE — Telephone Encounter (Signed)
Patient's mother is calling to check the status of the request to switch providers.  Please contact patient once it has been approved so that an appt. Can be schedule.  CB# [phone] (Mother)

## 2019-09-16 NOTE — Telephone Encounter (Signed)
Message Routed to PCP CMA 

## 2019-09-16 NOTE — Telephone Encounter (Signed)
Spoke with patient's mother. Informed her TOC has been approved by [employer]. Asked which MD is he wanting to transfer to. Mother reports she is unsure and that son has a virtual appointment with another doctor this evening and he will ask him if he will accept him as  new patient.

## 2019-09-17 NOTE — Telephone Encounter (Signed)
Dr. [place] are you willing to accept this TOC

## 2019-09-17 NOTE — Telephone Encounter (Signed)
Pt mother calling back. Pt would like to know if pt could be worked in for a new pt appointment. First available was in march. Please advise.

## 2019-09-18 ENCOUNTER — Telehealth: Payer: Self-pay | Admitting: Family Medicine

## 2019-09-18 ENCOUNTER — Other Ambulatory Visit: Payer: Self-pay | Admitting: Adult Health

## 2019-09-18 DIAGNOSIS — E1149 Type 2 diabetes mellitus with other diabetic neurological complication: Secondary | ICD-10-CM

## 2019-09-18 NOTE — Telephone Encounter (Signed)
Spoke with patient's mom. Informed her that her son was a patient of Dr. Elvis Coil and did a TOC from [place] to [place]. And at this point I dont have any other providers who are accepting New Patients here at [facility] and that he can transfer to another [facility] practice. Mom verbalized understanding.

## 2019-09-18 NOTE — Telephone Encounter (Signed)
Please review tel encounter 06/24/2018. I think other providers are accepting new pts.  Thanks, BJ

## 2019-09-18 NOTE — Telephone Encounter (Signed)
Mom called to schedule appointment with dr cody.  After I schedule  And hung up it looks like pt see provider @ [facility].  Does pt want to transfer care here to [facility] if so please send note to both providers before scheduling.  I canceled 2/4 appointment I made with mom.  Also verify address

## 2019-09-18 NOTE — Telephone Encounter (Signed)
Patient called back.  Stated he would like to come to our office to start seeing Dr Selena Batten  Is this Transfer of care okay?

## 2019-09-21 ENCOUNTER — Other Ambulatory Visit: Payer: Self-pay | Admitting: Adult Health

## 2019-09-21 DIAGNOSIS — J449 Chronic obstructive pulmonary disease, unspecified: Secondary | ICD-10-CM

## 2019-09-21 NOTE — Telephone Encounter (Signed)
That is fine with me.

## 2019-09-24 NOTE — Telephone Encounter (Signed)
Is this transfer okay?

## 2019-09-24 NOTE — Telephone Encounter (Signed)
Left patient a message to call office  Need a TOC visit with Dr Selena Batten

## 2019-09-24 NOTE — Telephone Encounter (Signed)
That is fine with me.

## 2019-10-02 ENCOUNTER — Other Ambulatory Visit: Payer: Self-pay

## 2019-10-02 ENCOUNTER — Ambulatory Visit (HOSPITAL_COMMUNITY)
Admission: RE | Admit: 2019-10-02 | Discharge: 2019-10-02 | Disposition: A | Discharge status: Home / Self Care | Payer: 59 | Source: Ambulatory Visit | Attending: Internal Medicine | Admitting: Internal Medicine

## 2019-10-02 ENCOUNTER — Other Ambulatory Visit (HOSPITAL_COMMUNITY): Payer: Self-pay | Admitting: Specialist

## 2019-10-02 ENCOUNTER — Encounter (HOSPITAL_COMMUNITY): Payer: Self-pay | Admitting: Specialist

## 2019-10-02 DIAGNOSIS — M79662 Pain in left lower leg: Secondary | ICD-10-CM | POA: Diagnosis not present

## 2019-10-02 DIAGNOSIS — M79605 Pain in left leg: Secondary | ICD-10-CM

## 2019-10-05 ENCOUNTER — Encounter: Payer: Self-pay | Admitting: Family Medicine

## 2019-10-05 ENCOUNTER — Other Ambulatory Visit: Payer: Self-pay

## 2019-10-05 ENCOUNTER — Ambulatory Visit: Payer: 59 | Admitting: Family Medicine

## 2019-10-05 VITALS — BP 132/78 | HR 77 | Temp 97.3°F | Ht 71.75 in | Wt 287.5 lb

## 2019-10-05 DIAGNOSIS — Z7901 Long term (current) use of anticoagulants: Secondary | ICD-10-CM | POA: Diagnosis not present

## 2019-10-05 DIAGNOSIS — Z800 Family history of malignant neoplasm of digestive organs: Secondary | ICD-10-CM

## 2019-10-05 DIAGNOSIS — G629 Polyneuropathy, unspecified: Secondary | ICD-10-CM | POA: Diagnosis not present

## 2019-10-05 DIAGNOSIS — G4733 Obstructive sleep apnea (adult) (pediatric): Secondary | ICD-10-CM | POA: Diagnosis not present

## 2019-10-05 DIAGNOSIS — J449 Chronic obstructive pulmonary disease, unspecified: Secondary | ICD-10-CM | POA: Diagnosis not present

## 2019-10-05 DIAGNOSIS — Z122 Encounter for screening for malignant neoplasm of respiratory organs: Secondary | ICD-10-CM

## 2019-10-05 DIAGNOSIS — Z720 Tobacco use: Secondary | ICD-10-CM

## 2019-10-05 DIAGNOSIS — E1149 Type 2 diabetes mellitus with other diabetic neurological complication: Secondary | ICD-10-CM | POA: Diagnosis not present

## 2019-10-05 DIAGNOSIS — G4730 Sleep apnea, unspecified: Secondary | ICD-10-CM | POA: Insufficient documentation

## 2019-10-05 LAB — COMPREHENSIVE METABOLIC PANEL
ALT: 32 U/L (ref 0–53)
AST: 15 U/L (ref 0–37)
Albumin: 3.8 g/dL (ref 3.5–5.2)
Alkaline Phosphatase: 81 U/L (ref 39–117)
BUN: 20 mg/dL (ref 6–23)
CO2: 30 mEq/L (ref 19–32)
Calcium: 9.2 mg/dL (ref 8.4–10.5)
Chloride: 104 mEq/L (ref 96–112)
Creatinine, Ser: 1.06 mg/dL (ref 0.40–1.50)
GFR: 71.59 mL/min (ref >60.00)
Glucose, Bld: 125 mg/dL — ABNORMAL HIGH (ref 70–99)
Potassium: 4.7 mEq/L (ref 3.5–5.1)
Sodium: 139 mEq/L (ref 135–145)
Total Bilirubin: 0.3 mg/dL (ref 0.2–1.2)
Total Protein: 6.6 g/dL (ref 6.0–8.3)

## 2019-10-05 LAB — LIPID PANEL
Cholesterol: 167 mg/dL (ref 0–200)
HDL: 54.5 mg/dL (ref >39.00)
LDL Cholesterol: 98 mg/dL (ref 0–99)
NonHDL: 112.86
Total CHOL/HDL Ratio: 3
Triglycerides: 72 mg/dL (ref 0.0–149.0)
VLDL: 14.4 mg/dL (ref 0.0–40.0)

## 2019-10-05 LAB — HEMOGLOBIN A1C: Hgb A1c MFr Bld: 7.2 % — ABNORMAL HIGH (ref 4.6–6.5)

## 2019-10-05 MED ORDER — FLUTICASONE-SALMETEROL 250-50 MCG/DOSE IN AEPB: 1.0000 | INHALATION_SPRAY | Freq: Two times a day (BID) | RESPIRATORY_TRACT | 3 refills | Status: DC

## 2019-10-05 NOTE — Patient Instructions (Addendum)
#  COPD - Pulmonology referral - See if the Advair is more affordable at the pharmacy - if it is cheaper than the symbicort -- stop symbicort and start Advair - take Advair twice daily EVERYDAY - Use Albuterol (rescue inhaler) as needed for severe symptoms - ideally you will not use this every day  #Diabetes - labs today - continue metformin - work on diet and weight loss

## 2019-10-05 NOTE — Assessment & Plan Note (Signed)
Lab Results  Component Value Date   HGBA1C 7.0 (H) 09/12/2018   Will repeat HgbA1c. Encouraged healthy diet. Will work on pulm function to help get exercise.

## 2019-10-05 NOTE — Assessment & Plan Note (Signed)
Nocturnal O2. Unable to find any PFTs and per patient has not seen pulm. Due to cost has poor adherence to symbicort, will see if advair is more affordable. Discussed difference between controller and rescue inhaler. Will likely readdress at future visits. Suspect untreated sleep apnea may be playing into noctural O2. Feel that pulm referral will be helpful to get PFTs, sleep study, and cancer screening given tobacco use disorder. Return 3 months or sooner.

## 2019-10-05 NOTE — Assessment & Plan Note (Addendum)
Indefinite due to several blood clots. Cont rivaroxaban.

## 2019-10-05 NOTE — Assessment & Plan Note (Signed)
Continue lyrica 

## 2019-10-05 NOTE — Progress Notes (Signed)
Subjective:     Dustin Koch is a 59 y.o. male presenting for Establish Care (previous PCP with [facility]) and Oxygen order (would like to buy Oxygen tank instead of renting it. uses 2 liters at night time only)     HPI   #diabetes - on metformin - last hgb a1c unknown - does not follow a diabetic diet - last eye exam was in June/July  #COPD - oxygen at night - still smoking - using inhalers - not ready to quit smoking - has not done lung cancer screening - using rescue inhaler 1-2 times  - occasionally uses O2 during the day - does not take symbicort every day due to cost   Review of Systems   Social History   Tobacco Use  Smoking Status Current Every Day Smoker  . Packs/day: 1.00  . Years: 40.00  . Pack years: 40.00  . Types: Cigarettes  Smokeless Tobacco Never Used        Objective:    BP Readings from Last 3 Encounters:  10/05/19 132/78  06/26/19 130/89  06/05/19 (!) 152/90   Wt Readings from Last 3 Encounters:  10/05/19 287 lb 8 oz (130.4 kg)  06/26/19 285 lb 7 oz (129.5 kg)  09/12/18 285 lb (129.3 kg)    BP 132/78   Pulse 77   Temp (!) 97.3 F (36.3 C)   Ht 5' 11.75" (1.822 m)   Wt 287 lb 8 oz (130.4 kg)   SpO2 94%   BMI 39.26 kg/m    Physical Exam Constitutional:      Appearance: Normal appearance. He is obese. He is not ill-appearing or diaphoretic.  HENT:     Right Ear: External ear normal.     Left Ear: External ear normal.     Nose: Nose normal.  Eyes:     General: No scleral icterus.    Extraocular Movements: Extraocular movements intact.     Conjunctiva/sclera: Conjunctivae normal.  Cardiovascular:     Rate and Rhythm: Normal rate and regular rhythm.     Heart sounds: Heart sounds are distant.  Pulmonary:     Effort: Pulmonary effort is normal. Prolonged expiration present.     Breath sounds: Decreased air movement present. Wheezing (diffuse) present.  Musculoskeletal:     Cervical back: Neck supple.  Skin:  General: Skin is warm and dry.  Neurological:     Mental Status: He is alert. Mental status is at baseline.  Psychiatric:        Mood and Affect: Mood normal.           Assessment & Plan:   Problem List Items Addressed This Visit      Respiratory   COPD, mild ([place]) - Primary    Nocturnal O2. Unable to find any PFTs and per patient has not seen pulm. Due to cost has poor adherence to symbicort, will see if advair is more affordable. Discussed difference between controller and rescue inhaler. Will likely readdress at future visits. Suspect untreated sleep apnea may be playing into noctural O2. Feel that pulm referral will be helpful to get PFTs, sleep study, and cancer screening given tobacco use disorder. Return 3 months or sooner.       Relevant Medications   Fluticasone-Salmeterol (ADVAIR DISKUS) 250-50 MCG/DOSE AEPB   Other Relevant Orders   Ambulatory referral to Pulmonology   Sleep apnea   Relevant Orders   Ambulatory referral to Pulmonology     Endocrine   DM (  diabetes mellitus), type 2 with neurological complications [facility])    Lab Results  Component Value Date   HGBA1C 7.0 (H) 09/12/2018   Will repeat HgbA1c. Encouraged healthy diet. Will work on pulm function to help get exercise.       Relevant Orders   Comprehensive metabolic panel   Hemoglobin A1c   Lipid panel     Nervous and Auditory   Peripheral polyneuropathy    Continue lyrica.         Other   Tobacco use    Not ready to quit. Interested in lung cancer screening.       Chronic anticoagulation    Indefinite due to several blood clots. Cont rivaroxaban.       Family history of colon cancer in father    Discussed need for colonoscopy. Father's cancer dx at age 26, pt has never been screened. Referral placed.       Relevant Orders   Ambulatory referral to Gastroenterology    Other Visit Diagnoses    Encounter for screening for lung cancer       Relevant Orders   Ambulatory referral to  Pulmonology       Return in about 3 months (around 01/02/2020).  Lynnda Child, MD

## 2019-10-05 NOTE — Assessment & Plan Note (Signed)
Discussed need for colonoscopy. Father's cancer dx at age 59, pt has never been screened. Referral placed.

## 2019-10-05 NOTE — Assessment & Plan Note (Signed)
Not ready to quit. Interested in lung cancer screening.

## 2019-10-08 ENCOUNTER — Ambulatory Visit: Payer: 59 | Admitting: Family Medicine

## 2019-10-12 ENCOUNTER — Ambulatory Visit: Payer: 59 | Admitting: Family Medicine

## 2019-12-17 ENCOUNTER — Other Ambulatory Visit: Payer: Self-pay | Admitting: Adult Health

## 2019-12-17 DIAGNOSIS — K219 Gastro-esophageal reflux disease without esophagitis: Secondary | ICD-10-CM

## 2019-12-17 DIAGNOSIS — Z7901 Long term (current) use of anticoagulants: Secondary | ICD-10-CM

## 2019-12-17 NOTE — Telephone Encounter (Signed)
PT IS NO LONGER UNDER [facility] CARE.

## 2019-12-27 ENCOUNTER — Other Ambulatory Visit: Payer: Self-pay | Admitting: Adult Health

## 2019-12-27 DIAGNOSIS — I10 Essential (primary) hypertension: Secondary | ICD-10-CM

## 2019-12-29 NOTE — Telephone Encounter (Signed)
DENIED.  PT IS NO LONGER SEEN AT THIS OFFICE.

## 2019-12-31 ENCOUNTER — Encounter: Payer: Self-pay | Admitting: Family Medicine

## 2020-01-06 ENCOUNTER — Encounter: Payer: Self-pay | Admitting: Family Medicine

## 2020-01-06 ENCOUNTER — Other Ambulatory Visit: Payer: Self-pay

## 2020-01-06 ENCOUNTER — Ambulatory Visit: Payer: 59 | Admitting: Family Medicine

## 2020-01-06 VITALS — BP 132/78 | HR 76 | Temp 97.9°F | Resp 20 | Ht 71.75 in | Wt 286.0 lb

## 2020-01-06 DIAGNOSIS — G629 Polyneuropathy, unspecified: Secondary | ICD-10-CM | POA: Diagnosis not present

## 2020-01-06 DIAGNOSIS — E1149 Type 2 diabetes mellitus with other diabetic neurological complication: Secondary | ICD-10-CM

## 2020-01-06 DIAGNOSIS — I10 Essential (primary) hypertension: Secondary | ICD-10-CM

## 2020-01-06 DIAGNOSIS — G2581 Restless legs syndrome: Secondary | ICD-10-CM

## 2020-01-06 DIAGNOSIS — Z7901 Long term (current) use of anticoagulants: Secondary | ICD-10-CM

## 2020-01-06 DIAGNOSIS — R911 Solitary pulmonary nodule: Secondary | ICD-10-CM

## 2020-01-06 DIAGNOSIS — Z720 Tobacco use: Secondary | ICD-10-CM

## 2020-01-06 DIAGNOSIS — J449 Chronic obstructive pulmonary disease, unspecified: Secondary | ICD-10-CM

## 2020-01-06 LAB — POCT GLYCOSYLATED HEMOGLOBIN (HGB A1C): Hemoglobin A1C: 6.9 % — AB (ref 4.0–5.6)

## 2020-01-06 MED ORDER — ROPINIROLE HCL 0.5 MG PO TABS: 0.5000 mg | ORAL_TABLET | Freq: Every day | ORAL | 3 refills | Status: DC

## 2020-01-06 MED ORDER — RIVAROXABAN 20 MG PO TABS: 20.0000 mg | ORAL_TABLET | Freq: Every day | ORAL | 3 refills | Status: DC

## 2020-01-06 MED ORDER — TIOTROPIUM BROMIDE MONOHYDRATE 18 MCG IN CAPS: 18.0000 ug | ORAL_CAPSULE | Freq: Every day | RESPIRATORY_TRACT | 3 refills | Status: DC

## 2020-01-06 MED ORDER — PREGABALIN 300 MG PO CAPS: 300.0000 mg | ORAL_CAPSULE | Freq: Two times a day (BID) | ORAL | 2 refills | Status: DC

## 2020-01-06 MED ORDER — METFORMIN HCL 500 MG PO TABS: 500.0000 mg | ORAL_TABLET | Freq: Two times a day (BID) | ORAL | 3 refills | Status: DC

## 2020-01-06 MED ORDER — LOSARTAN POTASSIUM 25 MG PO TABS: 25.0000 mg | ORAL_TABLET | Freq: Every day | ORAL | 3 refills | Status: DC

## 2020-01-06 NOTE — Assessment & Plan Note (Signed)
Good control. Continue lyrica for neuropathy and regular foot exams. Continue metformin

## 2020-01-06 NOTE — Assessment & Plan Note (Signed)
Not interested in quitting at this time. Encourage pulm f/u for cancer screening.

## 2020-01-06 NOTE — Assessment & Plan Note (Signed)
Discussed with patient finding from >10 years ago w/ recommended f/u in 12 months. Has had CXR since then w/o signs of nodules but given current smoking status emphasized importance of follow-up. He plans to call pulmonology to set up appointment to include lung cancer screening, OSA eval, and COPD. He will call back for CT scan if difficulty getting appointment with them.

## 2020-01-06 NOTE — Progress Notes (Signed)
Subjective:     Dustin Koch is a 59 y.o. 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 Packs/day: 1.00  . Years: 40.00  . Pack years: 40.00  . Types: Cigarettes  Smokeless Tobacco Never Used        Objective:    BP Readings from Last 3 Encounters:  01/06/20 132/78  10/05/19 132/78  06/26/19 130/89   Wt Readings from Last 3 Encounters:  01/06/20 286 lb (129.7 kg)  10/05/19 287 lb 8 oz (130.4 kg)  06/26/19 285 lb 7 oz (129.5 kg)    BP 132/78   Pulse 76   Temp 97.9 F (36.6 C)   Resp 20   Ht 5' 11.75" (1.822 m)   Wt 286 lb (129.7 kg)   SpO2 94%   BMI 39.06 kg/m    Physical Exam Constitutional:      Appearance: Normal appearance. He is not ill-appearing or diaphoretic.  HENT:     Right Ear: External ear normal.     Left Ear: External ear normal.     Nose: Nose normal.  Eyes:     General: No scleral icterus.    Extraocular Movements: Extraocular movements intact.     Conjunctiva/sclera: Conjunctivae normal.  Cardiovascular:     Rate and Rhythm: Normal rate and regular rhythm.     Heart sounds: No murmur.  Pulmonary:     Effort: Pulmonary effort is normal.     Breath sounds: Wheezing present.  Musculoskeletal:     Cervical back: Neck supple.  Skin:    General: Skin is warm and dry.  Neurological:     Mental Status: He is alert. Mental status is at baseline.  Psychiatric:        Mood and Affect: Mood normal.    Diabetic Foot Exam - Simple   Simple Foot  Form Diabetic Foot exam was performed with the following findings: Yes 01/06/2020  8:28 AM  Visual Inspection Sensation Testing Pulse Check Comments    Decreased sensation to monofilament testing on both feet       Assessment & Plan:   Problem List Items Addressed This Visit      Cardiovascular and Mediastinum   HTN (hypertension)    BP at goal. Continue losartan.       Relevant Medications   losartan (COZAAR) 25 MG tablet   rivaroxaban (XARELTO) 20 MG TABS tablet     Respiratory   COPD, mild (HCC)    Breathing is good, rare use of rescue inhaler. Discussed given noctural O2, would like him to see pulmonology. Has financial and time barriers, but he will try to make an appointment      Relevant Medications   tiotropium (SPIRIVA) 18 MCG inhalation capsule     Endocrine   DM (diabetes mellitus), type 2 with neurological complications (HCC) - Primary    Good control. Continue lyrica for neuropathy and  regular foot exams. Continue metformin      Relevant Medications   metFORMIN (GLUCOPHAGE) 500 MG tablet   losartan (COZAAR) 25 MG tablet   Other Relevant Orders   POCT glycosylated hemoglobin (Hb A1C) (Completed)   Ambulatory referral to Ophthalmology     Nervous and Auditory   Peripheral polyneuropathy   Relevant Medications   pregabalin (LYRICA) 300 MG capsule   rOPINIRole (REQUIP) 0.5 MG tablet     Other   Nodule of middle lobe of right lung    Discussed with patient finding from >10 years ago w/ recommended f/u in 12 months. Has had CXR since then w/o signs of nodules but given current smoking status emphasized importance of follow-up. He plans to call pulmonology to set up appointment to include lung cancer screening, OSA eval, and COPD. He will call back for CT scan if difficulty getting appointment with them.       Tobacco use    Not interested in quitting at this time. Encourage pulm f/u for cancer screening.       Chronic anticoagulation   Relevant  Medications   rivaroxaban (XARELTO) 20 MG TABS tablet    Other Visit Diagnoses    Restless legs       Relevant Medications   rOPINIRole (REQUIP) 0.5 MG tablet   COPD, severe (HCC)       Relevant Medications   tiotropium (SPIRIVA) 18 MCG inhalation capsule       Return in about 3 months (around 04/07/2020).  Lesleigh Noe, MD

## 2020-01-06 NOTE — Patient Instructions (Signed)
Great to see you!  Return in 3 months  Call pulmonology You should get a phone call about the opthalmology referral

## 2020-01-06 NOTE — Assessment & Plan Note (Signed)
BP at goal Continue losartan.  

## 2020-01-06 NOTE — Assessment & Plan Note (Signed)
Breathing is good, rare use of rescue inhaler. Discussed given noctural O2, would like him to see pulmonology. Has financial and time barriers, but he will try to make an appointment

## 2020-01-08 ENCOUNTER — Other Ambulatory Visit: Payer: Self-pay

## 2020-01-08 ENCOUNTER — Encounter: Payer: Self-pay | Admitting: Internal Medicine

## 2020-01-08 ENCOUNTER — Ambulatory Visit (INDEPENDENT_AMBULATORY_CARE_PROVIDER_SITE_OTHER): Payer: 59 | Admitting: Internal Medicine

## 2020-01-08 ENCOUNTER — Ambulatory Visit (INDEPENDENT_AMBULATORY_CARE_PROVIDER_SITE_OTHER): Payer: 59

## 2020-01-08 VITALS — BP 128/80 | HR 86 | Temp 97.8°F | Ht 71.75 in | Wt 279.8 lb

## 2020-01-08 DIAGNOSIS — J449 Chronic obstructive pulmonary disease, unspecified: Secondary | ICD-10-CM

## 2020-01-08 DIAGNOSIS — Z720 Tobacco use: Secondary | ICD-10-CM

## 2020-01-08 DIAGNOSIS — R911 Solitary pulmonary nodule: Secondary | ICD-10-CM | POA: Diagnosis not present

## 2020-01-08 DIAGNOSIS — R0683 Snoring: Secondary | ICD-10-CM

## 2020-01-08 MED ORDER — TRELEGY ELLIPTA 100-62.5-25 MCG/INH IN AEPB: INHALATION_SPRAY | RESPIRATORY_TRACT | 12 refills | Status: DC

## 2020-01-08 NOTE — Progress Notes (Signed)
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 Medical problem list includes DVT/ Xarelto, HTN, Allergic Rhinitis, COPD, Lung Nodule, OSA( no sleep study), GERD, DM2,  Home O2 Inogen 2L sleep/ Inogen Meds- Advair250, Spiriva handihaler, albuterol hfa,  Smoke 1/2-1 ppd He has smoked 40 years. Told several years ago of abnormality on CXR. No nodule described on CT of 11/2015. 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  Daily cough thick white, no blood. Denies acute change. He admits DOE on stairs. Uses Advair and Spiriva once daily, Ventolin hfa every 2-3 weeks.  1Phizer Covax CT a chest 11/07/15- No evidence for pulmonary embolism. No acute process within the chest. The left kidney is not visualized. Recommend correlation for possibility of prior surgical removal. Otherwise, further evaluation with abdominal/renal ultrasound could be performed. Hepatic steatosis.  Prior to Admission medications   Medication Sig Start Date End Date Taking? Authorizing Provider  albuterol (VENTOLIN HFA) 108 (90 Base) MCG/ACT inhaler Inhale 2 puffs into the lungs every 6 (six) hours as needed for wheezing or shortness of breath. 12/19/18  Yes Nafziger, Kandee Keen, NP  Fluticasone-Salmeterol (ADVAIR DISKUS) 250-50 MCG/DOSE AEPB Inhale 1 puff into the lungs 2 (two) times daily. 10/05/19  Yes Lynnda Child, MD  glucose blood test strip Use to test blood sugar daily. 12/19/18  Yes Nafziger, Kandee Keen, NP  losartan (COZAAR) 25 MG tablet Take 1 tablet (25 mg total) by mouth daily. 01/06/20  Yes Lynnda Child, MD  metFORMIN (GLUCOPHAGE) 500 MG tablet Take 1 tablet (500 mg total) by mouth 2 (two) times daily with a meal. 01/06/20  Yes Lynnda Child, MD  OXYGEN Inhale into the lungs. 2 liters at night only   Yes [provider]  pregabalin (LYRICA) 300 MG capsule Take 1  capsule (300 mg total) by mouth 2 (two) times daily. 01/06/20 04/05/20 Yes Lynnda Child, MD  rivaroxaban (XARELTO) 20 MG TABS tablet Take 1 tablet (20 mg total) by mouth daily with supper. 01/06/20  Yes Lynnda Child, MD  rOPINIRole (REQUIP) 0.5 MG tablet Take 1 tablet (0.5 mg total) by mouth at bedtime. 01/06/20  Yes Lynnda Child, MD  tiotropium (SPIRIVA) 18 MCG inhalation capsule Place 1 capsule (18 mcg total) into inhaler and inhale daily. 01/06/20 04/05/20 Yes Lynnda Child, MD  Fluticasone-Umeclidin-Vilant (TRELEGY ELLIPTA) 100-62.5-25 MCG/INH AEPB Inhale 1 puff, then rinse mouth, once daily 01/08/20   Waymon Budge, MD   Past Medical History:  Diagnosis Date  . Anticoagulant long-term use    xarelto-- managed by pcp---  hx dvt's/ pe's  . COPD (chronic obstructive pulmonary disease) (HCC)    followed by pcp--- (06-25-2019  per pt it's been several months since last exaberation, denies SOB, DOE, or difficulty breathing and stated does not use oxygen as needed for copd)  . Emphysema of lung (HCC)   . 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 History of MRSA infection    per pt approx.  2014 abscess goin/ thigh area  . History of pulmonary embolus (PE)    12/ 2010  bilateral PE  . Hypertension    followed by pcp  . Left hydrocele   . Nocturia   . Nocturnal oxygen desaturation  per pt uses O2 @ 2L via [place] only  . Peripheral polyneuropathy   . Sleep apnea    06-25-2019 per pt has never had a sleep study , however stated while in hospital yrs ago his oxygen sat's was checked during the night and was low;  that when he was started on oxygen at night via [place]   . Smokers' cough (HCC)    per pt occasionlly productive  . Type 2 diabetes mellitus (HCC)    followed by pcp---  pt states fasting cbg 120-135,  check's cbg twice weekly  . Wears glasses    Past Surgical History:  Procedure Laterality Date  . HYDROCELE EXCISION Left 06/26/2019    Procedure: HYDROCELECTOMY ADULT;  Surgeon: Ihor Gully, MD;  Location: [facility];  Service: Urology;  Laterality: Left;  . INCISION AND DRAINAGE ABSCESS  x2  2014  approx.   Family History  Problem Relation Age of Onset  . Diabetes Mother   . Colon cancer Father 40  . Other Brother        DDD   Social History   Socioeconomic History  . Marital status: Married    Spouse name: Aggie Cosier  . Number of children: 0  . Years of education: 2 years of Trade School  . Highest education level: Not on file  Occupational History  . Not on file  Tobacco Use  . Smoking status: Current Every Day Smoker    Packs/day: 1.00    Years: 40.00    Pack years: 40.00    Types: Cigarettes  . Smokeless tobacco: Never Used  . Tobacco comment: 1/2-1 pack per day   Substance and Sexual Activity  . Alcohol use: Not Currently    Comment: quit 16 years ago  . Drug use: Never  . Sexual activity: Yes    Birth control/protection: Post-menopausal  Other Topics Concern  . Not on file  Social History Narrative   10/05/19   From: the area   Living: with wife Aggie Cosier - on family farm near [place], spends weekdays helping his mom nearby   Work: Psychologist, occupational      Family: helping mom who requires assistance       Enjoys: no time for fun, enjoys fishing/hunting and outdoors      Exercise: not currently, breathing and lack of energy   Diet: does not follow diabetic diet - eats a lot of fast food, mom gets meals on wheels      Safety   Seat belts: Yes    Guns: Yes  and not secure   Safe in relationships: Yes    Social Determinants of Health   Financial Resource Strain:   . Difficulty of Paying Living Expenses:   Food Insecurity:   . Worried About Programme researcher, broadcasting/film/video in the Last Year:   . Barista in the Last Year:   Transportation Needs:   . Freight forwarder (Medical):   Marland Kitchen Lack of Transportation (Non-Medical):   Physical Activity:   . Days of Exercise per Week:   .  Minutes of Exercise per Session:   Stress:   . Feeling of Stress :   Social Connections:   . Frequency of Communication with Friends and Family:   . Frequency of Social Gatherings with Friends and Family:   . Attends Religious Services:   . Active Member of Clubs or Organizations:   . Attends Banker Meetings:   Marland Kitchen Marital Status:   Intimate  Partner Violence:   . Fear of Current or Ex-Partner:   . Emotionally Abused:   Marland Kitchen Physically Abused:   . Sexually Abused:    ROS-see HPI   + = positive Constitutional:    weight loss, night sweats, fevers, chills, fatigue, lassitude. HEENT:    headaches, difficulty swallowing, tooth/dental problems, sore throat,       +sneezing, itching, ear ache, nasal congestion, post nasal drip, snoring CV:    chest pain, orthopnea, PND, swelling in lower extremities, anasarca,                                  dizziness, palpitations Resp:   +shortness of breath with exertion or at rest.               + productive cough,   non-productive cough, coughing up of blood.              change in color of mucus.  wheezing.   Skin:    rash or lesions. GI:  No-   heartburn, indigestion, abdominal pain, nausea, vomiting, diarrhea,                 change in bowel habits, loss of appetite GU: dysuria, change in color of urine, no urgency or frequency.   flank pain. MS:  + joint pain, stiffness, decreased range of motion, back pain. Neuro-     nothing unusual Psych:  change in mood or affect.  depression or anxiety.   memory loss.  OBJ- Physical Exam General- Alert, Oriented, Affect-appropriate, Distress- none acute, + muscular/ overweight Skin- rash-none, lesions- none, excoriation- none Lymphadenopathy- none Head- atraumatic            Eyes- Gross vision intact, PERRLA, conjunctivae and secretions clear            Ears- Hearing, canals-normal            Nose- Clear, no-Septal dev, mucus, polyps, erosion, perforation             Throat- Mallampati II ,  mucosa clear , drainage- none, tonsils- atrophic Neck- flexible , trachea midline, no stridor , thyroid nl, carotid no bruit Chest - symmetrical excursion , unlabored           Heart/CV- RRR , no murmur , no gallop  , no rub, nl s1 s2                           - JVD- none , edema- none, stasis changes- none, varices- none           Lung- + mild wheeze, scattered rhonchi, unlabored, cough+ deep , dullness-none, rub- none           Chest wall-  Abd-  Br/ Gen/ Rectal- Not done, not indicated Extrem- cyanosis- none, clubbing, none, atrophy- none, strength- nl Neuro- grossly intact to observation

## 2020-01-08 NOTE — Patient Instructions (Signed)
Order- CXR    Dx COPD mixed type  Samples x 2 Trelegy 200    Inhale 1 puff, then rinse mouth, once daily Try this instead of Advair and Spiriva. When the samples run out, go back to Advair and Spiriva, or switch to the Trelegy full time.   Script printed for Trelegy 100 inhaler    - see what this would cost, compared to buying both Advair and Spiriva.  Please consider trytng to stop smoking. We have a pharmacy program that can help when you decide you are ready.

## 2020-01-10 NOTE — Assessment & Plan Note (Signed)
Long-time smoker and Psychologist, occupational. He is lucky he isn't much worse, but actual impairment seems mild clinically. He doesn't seem to notice much effect from bronchodilators, and may have very little bronchospasm.  Plan- Try samples of Trelegy for comparison. CXR. Will plan on PFT later.

## 2020-01-10 NOTE — Assessment & Plan Note (Signed)
He says he has never had a sleep study. We will explore this issue further as needed.

## 2020-01-10 NOTE — Assessment & Plan Note (Signed)
Discussed smoking cessation importance, and available support.  He say he doesn't have his head in the issue and declines support for now.

## 2020-01-10 NOTE — Assessment & Plan Note (Signed)
Not described on CT from 11/07/15 Plan- CXR

## 2020-01-11 ENCOUNTER — Telehealth: Payer: Self-pay | Admitting: Internal Medicine

## 2020-01-11 NOTE — Telephone Encounter (Signed)
CXR results have been released. There is stable COPD with no new process.

## 2020-01-11 NOTE — Telephone Encounter (Signed)
Requests Chest xray results done on 01/08/20 seen by Dr. Maple Hudson

## 2020-01-11 NOTE — Telephone Encounter (Signed)
See prior message patient was already notified regarding his CRX. Nothing else further needed.

## 2020-02-05 LAB — HM DIABETES EYE EXAM

## 2020-05-13 ENCOUNTER — Ambulatory Visit: Payer: 59 | Admitting: Internal Medicine

## 2020-06-02 DIAGNOSIS — Z0279 Encounter for issue of other medical certificate: Secondary | ICD-10-CM

## 2020-06-23 ENCOUNTER — Other Ambulatory Visit: Payer: Self-pay | Admitting: Family Medicine

## 2020-06-23 DIAGNOSIS — J449 Chronic obstructive pulmonary disease, unspecified: Secondary | ICD-10-CM

## 2020-11-17 ENCOUNTER — Other Ambulatory Visit: Payer: Self-pay

## 2020-11-17 ENCOUNTER — Encounter: Payer: Self-pay | Admitting: Family Medicine

## 2020-11-17 ENCOUNTER — Telehealth (INDEPENDENT_AMBULATORY_CARE_PROVIDER_SITE_OTHER): Payer: 59 | Admitting: Family Medicine

## 2020-11-17 DIAGNOSIS — I10 Essential (primary) hypertension: Secondary | ICD-10-CM | POA: Diagnosis not present

## 2020-11-17 DIAGNOSIS — G629 Polyneuropathy, unspecified: Secondary | ICD-10-CM | POA: Diagnosis not present

## 2020-11-17 DIAGNOSIS — E1149 Type 2 diabetes mellitus with other diabetic neurological complication: Secondary | ICD-10-CM

## 2020-11-17 DIAGNOSIS — J441 Chronic obstructive pulmonary disease with (acute) exacerbation: Secondary | ICD-10-CM

## 2020-11-17 DIAGNOSIS — G2581 Restless legs syndrome: Secondary | ICD-10-CM

## 2020-11-17 DIAGNOSIS — Z7901 Long term (current) use of anticoagulants: Secondary | ICD-10-CM

## 2020-11-17 DIAGNOSIS — J449 Chronic obstructive pulmonary disease, unspecified: Secondary | ICD-10-CM

## 2020-11-17 MED ORDER — PREDNISONE 20 MG PO TABS: 40.0000 mg | ORAL_TABLET | Freq: Every day | ORAL | 0 refills | Status: AC

## 2020-11-17 MED ORDER — TIOTROPIUM BROMIDE MONOHYDRATE 18 MCG IN CAPS: 18.0000 ug | ORAL_CAPSULE | Freq: Every day | RESPIRATORY_TRACT | 3 refills | Status: DC

## 2020-11-17 MED ORDER — RIVAROXABAN 20 MG PO TABS
20.0000 mg | ORAL_TABLET | Freq: Every day | ORAL | 3 refills | Status: DC
Start: 2020-11-17 — End: 2021-03-21

## 2020-11-17 MED ORDER — METFORMIN HCL 500 MG PO TABS
500.0000 mg | ORAL_TABLET | Freq: Two times a day (BID) | ORAL | 3 refills | Status: DC
Start: 2020-11-17 — End: 2021-03-21

## 2020-11-17 MED ORDER — PREGABALIN 300 MG PO CAPS: 300.0000 mg | ORAL_CAPSULE | Freq: Two times a day (BID) | ORAL | 1 refills | Status: DC

## 2020-11-17 MED ORDER — LOSARTAN POTASSIUM 25 MG PO TABS: 25.0000 mg | ORAL_TABLET | Freq: Every day | ORAL | 3 refills | Status: DC

## 2020-11-17 MED ORDER — IPRATROPIUM-ALBUTEROL 0.5-2.5 (3) MG/3ML IN SOLN: 3.0000 mL | Freq: Four times a day (QID) | RESPIRATORY_TRACT | 0 refills | Status: DC | PRN

## 2020-11-17 MED ORDER — FLUTICASONE-SALMETEROL 250-50 MCG/DOSE IN AEPB: INHALATION_SPRAY | RESPIRATORY_TRACT | 3 refills | Status: DC

## 2020-11-17 MED ORDER — AZITHROMYCIN 250 MG PO TABS: ORAL_TABLET | ORAL | 0 refills | Status: DC

## 2020-11-17 MED ORDER — ROPINIROLE HCL 0.5 MG PO TABS: 0.5000 mg | ORAL_TABLET | Freq: Every day | ORAL | 3 refills | Status: DC

## 2020-11-17 MED ORDER — ALBUTEROL SULFATE HFA 108 (90 BASE) MCG/ACT IN AERS
2.0000 | INHALATION_SPRAY | Freq: Four times a day (QID) | RESPIRATORY_TRACT | 1 refills | Status: DC | PRN
Start: 2020-11-17 — End: 2021-03-21

## 2020-11-17 NOTE — Progress Notes (Signed)
I connected with [name] on 11/17/20 at  2:20 PM EDT by video and verified that I am speaking with the correct person using two identifiers.   I discussed the limitations, risks, security and privacy concerns of performing an evaluation and management service by video and the availability of in person appointments. I also discussed with the patient that there may be a patient responsible charge related to this service. The patient expressed understanding and agreed to proceed.  Patient location: Home Provider Location: [facility] Participants: Lynnda Child and [name]   Subjective:     Dustin Koch is a 60 y.o. male presenting for Cough, Nasal Congestion, Ear Pain, and Wants a refill of meds     Cough Associated symptoms include ear pain (pressure change, comes and goes) and wheezing. Pertinent negatives include no headaches or sore throat.  URI  This is a new problem. The current episode started more than 1 year ago. There has been no fever. Associated symptoms include congestion, coughing (productive, slightly more), ear pain (pressure change, comes and goes), a plugged ear sensation, sneezing and wheezing. Pertinent negatives include no headaches, nausea, sinus pain, sore throat or vomiting.    Has been out of many of his medications Mother passed away in 2023-07-27 and this has keep him busy  Out of diabetes medication for a few months  Feet are worse - ran out of gabapentin cannot do his job   Review of Systems  HENT: Positive for congestion, ear pain (pressure change, comes and goes) and sneezing. Negative for sinus pain and sore throat.   Respiratory: Positive for cough (productive, slightly more) and wheezing.   Gastrointestinal: Negative for nausea and vomiting.  Neurological: Negative for headaches.     Social History   Tobacco Use  Smoking Status Current Every Day Smoker  . Packs/day: 1.00  . Years: 40.00  . Pack years: 40.00  . Types:  Cigarettes  Smokeless Tobacco Never Used  Tobacco Comment   1/2-1 pack per day         Objective:   BP Readings from Last 3 Encounters:  01/08/20 128/80  01/06/20 132/78  10/05/19 132/78   Wt Readings from Last 3 Encounters:  01/08/20 279 lb 12.8 oz (126.9 kg)  01/06/20 286 lb (129.7 kg)  10/05/19 287 lb 8 oz (130.4 kg)    There were no vitals taken for this visit.   Physical Exam Constitutional:      Appearance: Normal appearance. He is not ill-appearing.  HENT:     Head: Normocephalic and atraumatic.     Right Ear: External ear normal.     Left Ear: External ear normal.  Eyes:     Conjunctiva/sclera: Conjunctivae normal.  Pulmonary:     Effort: Pulmonary effort is normal. No respiratory distress.     Comments: Nasal canula Neurological:     Mental Status: He is alert. Mental status is at baseline.  Psychiatric:        Mood and Affect: Mood normal.        Behavior: Behavior normal.        Thought Content: Thought content normal.        Judgment: Judgment normal.            Assessment & Plan:   Problem List Items Addressed This Visit      Cardiovascular and Mediastinum   Essential hypertension    No VS 2/2 to virtual. Refill losartan as  out of medication. Return 2 months in-person for f/u      Relevant Medications   losartan (COZAAR) 25 MG tablet   rivaroxaban (XARELTO) 20 MG TABS tablet     Respiratory   COPD with acute exacerbation (HCC) - Primary    Increased cough, wheezing, and sob with sinus congestion. Suspect exacerbation. Has noctural O2, wearing today on virtual visit. Prednisone and azithromycin for treatment. Return if not improving.       Relevant Medications   tiotropium (SPIRIVA) 18 MCG inhalation capsule   albuterol (VENTOLIN HFA) 108 (90 Base) MCG/ACT inhaler   Fluticasone-Salmeterol (ADVAIR DISKUS) 250-50 MCG/DOSE AEPB   azithromycin (ZITHROMAX) 250 MG tablet   predniSONE (DELTASONE) 20 MG tablet   ipratropium-albuterol  (DUONEB) 0.5-2.5 (3) MG/3ML SOLN     Endocrine   DM (diabetes mellitus), type 2 with neurological complications (HCC)    Out of medications for unknown period of time. Refill metformin. Return in 2 months.       Relevant Medications   losartan (COZAAR) 25 MG tablet   metFORMIN (GLUCOPHAGE) 500 MG tablet     Nervous and Auditory   Peripheral polyneuropathy    Worse in setting of running out of lyrica. Restart lyrica 300 mg bid. Return 2 months      Relevant Medications   pregabalin (LYRICA) 300 MG capsule   rOPINIRole (REQUIP) 0.5 MG tablet     Other   Chronic anticoagulation    Stable. Cont xarelto 20 mg daily - lifetime.       Relevant Medications   rivaroxaban (XARELTO) 20 MG TABS tablet    Other Visit Diagnoses    COPD, severe (HCC)       Relevant Medications   tiotropium (SPIRIVA) 18 MCG inhalation capsule   albuterol (VENTOLIN HFA) 108 (90 Base) MCG/ACT inhaler   Fluticasone-Salmeterol (ADVAIR DISKUS) 250-50 MCG/DOSE AEPB   azithromycin (ZITHROMAX) 250 MG tablet   predniSONE (DELTASONE) 20 MG tablet   ipratropium-albuterol (DUONEB) 0.5-2.5 (3) MG/3ML SOLN   COPD, mild (HCC)       Relevant Medications   tiotropium (SPIRIVA) 18 MCG inhalation capsule   albuterol (VENTOLIN HFA) 108 (90 Base) MCG/ACT inhaler   Fluticasone-Salmeterol (ADVAIR DISKUS) 250-50 MCG/DOSE AEPB   azithromycin (ZITHROMAX) 250 MG tablet   predniSONE (DELTASONE) 20 MG tablet   ipratropium-albuterol (DUONEB) 0.5-2.5 (3) MG/3ML SOLN   Restless legs       Relevant Medications   rOPINIRole (REQUIP) 0.5 MG tablet       Return in about 2 months (around 01/17/2021) for Diabetes.  Lynnda Child, MD

## 2020-11-17 NOTE — Assessment & Plan Note (Signed)
Worse in setting of running out of lyrica. Restart lyrica 300 mg bid. Return 2 months

## 2020-11-17 NOTE — Assessment & Plan Note (Signed)
No VS 2/2 to virtual. Refill losartan as out of medication. Return 2 months in-person for f/u

## 2020-11-17 NOTE — Assessment & Plan Note (Signed)
Out of medications for unknown period of time. Refill metformin. Return in 2 months.

## 2020-11-17 NOTE — Assessment & Plan Note (Signed)
Stable. Cont xarelto 20 mg daily - lifetime.

## 2020-11-17 NOTE — Assessment & Plan Note (Signed)
Increased cough, wheezing, and sob with sinus congestion. Suspect exacerbation. Has noctural O2, wearing today on virtual visit. Prednisone and azithromycin for treatment. Return if not improving.

## 2021-02-03 ENCOUNTER — Other Ambulatory Visit: Payer: Self-pay | Admitting: Family Medicine

## 2021-02-03 DIAGNOSIS — J449 Chronic obstructive pulmonary disease, unspecified: Secondary | ICD-10-CM

## 2021-03-13 ENCOUNTER — Ambulatory Visit: Payer: 59 | Admitting: Family Medicine

## 2021-03-21 ENCOUNTER — Telehealth: Payer: Self-pay | Admitting: *Deleted

## 2021-03-21 ENCOUNTER — Other Ambulatory Visit: Payer: Self-pay

## 2021-03-21 ENCOUNTER — Encounter: Payer: Self-pay | Admitting: Family Medicine

## 2021-03-21 ENCOUNTER — Ambulatory Visit (INDEPENDENT_AMBULATORY_CARE_PROVIDER_SITE_OTHER): Payer: 59 | Admitting: Family Medicine

## 2021-03-21 VITALS — BP 152/86 | HR 90 | Temp 97.8°F | Ht 71.75 in | Wt 269.0 lb

## 2021-03-21 DIAGNOSIS — Z560 Unemployment, unspecified: Secondary | ICD-10-CM

## 2021-03-21 DIAGNOSIS — J449 Chronic obstructive pulmonary disease, unspecified: Secondary | ICD-10-CM | POA: Diagnosis not present

## 2021-03-21 DIAGNOSIS — E785 Hyperlipidemia, unspecified: Secondary | ICD-10-CM | POA: Diagnosis not present

## 2021-03-21 DIAGNOSIS — J441 Chronic obstructive pulmonary disease with (acute) exacerbation: Secondary | ICD-10-CM

## 2021-03-21 DIAGNOSIS — E1149 Type 2 diabetes mellitus with other diabetic neurological complication: Secondary | ICD-10-CM

## 2021-03-21 DIAGNOSIS — G629 Polyneuropathy, unspecified: Secondary | ICD-10-CM

## 2021-03-21 DIAGNOSIS — Z86711 Personal history of pulmonary embolism: Secondary | ICD-10-CM

## 2021-03-21 DIAGNOSIS — I10 Essential (primary) hypertension: Secondary | ICD-10-CM | POA: Diagnosis not present

## 2021-03-21 DIAGNOSIS — Z7901 Long term (current) use of anticoagulants: Secondary | ICD-10-CM

## 2021-03-21 DIAGNOSIS — G2581 Restless legs syndrome: Secondary | ICD-10-CM

## 2021-03-21 MED ORDER — LOSARTAN POTASSIUM 50 MG PO TABS: 50.0000 mg | ORAL_TABLET | Freq: Every day | ORAL | 3 refills | Status: DC

## 2021-03-21 MED ORDER — PREDNISONE 20 MG PO TABS: 40.0000 mg | ORAL_TABLET | Freq: Every day | ORAL | 0 refills | Status: AC

## 2021-03-21 MED ORDER — ROPINIROLE HCL 0.5 MG PO TABS: 0.5000 mg | ORAL_TABLET | Freq: Every day | ORAL | 3 refills | Status: DC

## 2021-03-21 MED ORDER — FLUTICASONE-SALMETEROL 250-50 MCG/ACT IN AEPB: INHALATION_SPRAY | RESPIRATORY_TRACT | 1 refills | Status: DC

## 2021-03-21 MED ORDER — IPRATROPIUM-ALBUTEROL 0.5-2.5 (3) MG/3ML IN SOLN: 3.0000 mL | Freq: Four times a day (QID) | RESPIRATORY_TRACT | 0 refills | Status: DC | PRN

## 2021-03-21 MED ORDER — METFORMIN HCL 500 MG PO TABS: 500.0000 mg | ORAL_TABLET | Freq: Two times a day (BID) | ORAL | 3 refills | Status: DC

## 2021-03-21 MED ORDER — ALBUTEROL SULFATE HFA 108 (90 BASE) MCG/ACT IN AERS: 2.0000 | INHALATION_SPRAY | Freq: Four times a day (QID) | RESPIRATORY_TRACT | 1 refills | Status: DC | PRN

## 2021-03-21 MED ORDER — TIOTROPIUM BROMIDE MONOHYDRATE 18 MCG IN CAPS: 18.0000 ug | ORAL_CAPSULE | Freq: Every day | RESPIRATORY_TRACT | 3 refills | Status: DC

## 2021-03-21 MED ORDER — PREGABALIN 300 MG PO CAPS: 300.0000 mg | ORAL_CAPSULE | Freq: Two times a day (BID) | ORAL | 1 refills | Status: DC

## 2021-03-21 MED ORDER — RIVAROXABAN 20 MG PO TABS: 20.0000 mg | ORAL_TABLET | Freq: Every day | ORAL | 3 refills | Status: DC

## 2021-03-21 NOTE — Progress Notes (Signed)
Subjective:     Dustin Koch is a 60 y.o. male presenting for Follow-up and COPD     COPD His past medical history is significant for COPD.     #HTN - bp has been spiking at home - as high as 204/94 - checked along neighbors and they were similar - mood changes, no ha, cp, vision changes  #COPD - feels this progressively getting worse - 1 year since last pulmonology appt - new insurance in June - no income since Jan - taking advair and spiriva - using albuterol and duoneb at least once daily albuterol - using duoneb twice daily - using rescue inhalers regularly for last 3 months   - down to 1-2 cig per day - did try trelegy in the past - he still has some samples - thinks he had some throat swelling    Review of Systems   11/17/2020: virtual visit - htn - refilled meds, copd w/ exacerbation - treated. advised f/u for dm/Htn/neuropathy  Social History   Tobacco Use  Smoking Status Every Day   Packs/day: 1.00   Years: 40.00   Pack years: 40.00   Types: Cigarettes  Smokeless Tobacco Never  Tobacco Comments   1/2-1 pack per day         Objective:    BP Readings from Last 3 Encounters:  03/21/21 (!) 152/86  01/08/20 128/80  01/06/20 132/78   Wt Readings from Last 3 Encounters:  03/21/21 269 lb (122 kg)  01/08/20 279 lb 12.8 oz (126.9 kg)  01/06/20 286 lb (129.7 kg)    BP (!) 152/86   Pulse 90   Temp 97.8 F (36.6 C) (Temporal)   Ht 5' 11.75" (1.822 m)   Wt 269 lb (122 kg)   SpO2 96% Comment: on O2  BMI 36.74 kg/m    Physical Exam Constitutional:      Appearance: Normal appearance. He is not ill-appearing or diaphoretic.  HENT:     Right Ear: External ear normal.     Left Ear: External ear normal.     Nose: Nose normal.  Eyes:     General: No scleral icterus.    Extraocular Movements: Extraocular movements intact.     Conjunctiva/sclera: Conjunctivae normal.  Cardiovascular:     Rate and Rhythm: Normal rate and regular rhythm.   Pulmonary:     Effort: Pulmonary effort is normal. No tachypnea or accessory muscle usage.     Breath sounds: Decreased air movement present. Decreased breath sounds and wheezing present.  Musculoskeletal:     Cervical back: Neck supple.  Skin:    General: Skin is warm and dry.  Neurological:     Mental Status: He is alert. Mental status is at baseline.  Psychiatric:        Mood and Affect: Mood normal.          Assessment & Plan:   Problem List Items Addressed This Visit       Cardiovascular and Mediastinum   Essential hypertension    BP elevated. Increase losartan 25>50 mg. Cont home monitoring. Return 3 months       Relevant Medications   losartan (COZAAR) 50 MG tablet   rivaroxaban (XARELTO) 20 MG TABS tablet   Other Relevant Orders   Comprehensive metabolic panel     Respiratory   COPD with acute exacerbation (HCC) - Primary    Using rescue inhalers/nebulizer TID. Discussed high concern for worsening disease and need for pulm follow-up. Cost is  a huge barrier right now due to job loss and no disability at this time. Reviewed Dr. Roxy Cedar notes and pt has Trelegy samples - he will try and call back if working. If not cont advair/spiriva. Due to poor air movement will try course of steroids to see if that will improve his overall breathing. Cont prn albuterol/duoneb.        Relevant Medications   albuterol (VENTOLIN HFA) 108 (90 Base) MCG/ACT inhaler   fluticasone-salmeterol (ADVAIR) 250-50 MCG/ACT AEPB   ipratropium-albuterol (DUONEB) 0.5-2.5 (3) MG/3ML SOLN   tiotropium (SPIRIVA) 18 MCG inhalation capsule   predniSONE (DELTASONE) 20 MG tablet     Endocrine   DM (diabetes mellitus), type 2 with neurological complications (HCC)    Recheck Hgba1c today. Cont metformin 500 mg bid. Encouraged diabetic diet.        Relevant Medications   losartan (COZAAR) 50 MG tablet   metFORMIN (GLUCOPHAGE) 500 MG tablet   Other Relevant Orders   Hemoglobin A1c     Nervous  and Auditory   Peripheral polyneuropathy    Stable on lyrica 300 mg BID. Cont this       Relevant Medications   pregabalin (LYRICA) 300 MG capsule   rOPINIRole (REQUIP) 0.5 MG tablet     Other   History of pulmonary embolism    Needs life long xarelto 20 mg. Cont.        Chronic anticoagulation   Relevant Medications   rivaroxaban (XARELTO) 20 MG TABS tablet   Dyslipidemia    Lab Results  Component Value Date   LDLCALC 98 10/05/2019  Unclear why he is not on cholesterol medication. Will reassess if and recommend.        Relevant Orders   Lipid panel   Other Visit Diagnoses     COPD, severe (HCC)       Relevant Medications   albuterol (VENTOLIN HFA) 108 (90 Base) MCG/ACT inhaler   fluticasone-salmeterol (ADVAIR) 250-50 MCG/ACT AEPB   ipratropium-albuterol (DUONEB) 0.5-2.5 (3) MG/3ML SOLN   tiotropium (SPIRIVA) 18 MCG inhalation capsule   predniSONE (DELTASONE) 20 MG tablet   Other Relevant Orders   Comprehensive metabolic panel   COPD, mild (HCC)       Relevant Medications   albuterol (VENTOLIN HFA) 108 (90 Base) MCG/ACT inhaler   fluticasone-salmeterol (ADVAIR) 250-50 MCG/ACT AEPB   ipratropium-albuterol (DUONEB) 0.5-2.5 (3) MG/3ML SOLN   tiotropium (SPIRIVA) 18 MCG inhalation capsule   predniSONE (DELTASONE) 20 MG tablet   Restless legs       Relevant Medications   rOPINIRole (REQUIP) 0.5 MG tablet   Loss of job       Relevant Orders   [facility]B Referral to [facility]        Return in about 3 months (around 06/21/2021) for Diabetes, blood pressure, breathing.  Lynnda Child, MD  This visit occurred during the SARS-CoV-2 public health emergency.  Safety protocols were in place, including screening questions prior to the visit, additional usage of staff PPE, and extensive cleaning of exam room while observing appropriate contact time as indicated for disinfecting solutions.

## 2021-03-21 NOTE — Assessment & Plan Note (Signed)
BP elevated. Increase losartan 25>50 mg. Cont home monitoring. Return 3 months

## 2021-03-21 NOTE — Assessment & Plan Note (Signed)
Using rescue inhalers/nebulizer TID. Discussed high concern for worsening disease and need for pulm follow-up. Cost is a huge barrier right now due to job loss and no disability at this time. Reviewed Dr. Roxy Cedar notes and pt has Trelegy samples - he will try and call back if working. If not cont advair/spiriva. Due to poor air movement will try course of steroids to see if that will improve his overall breathing. Cont prn albuterol/duoneb.

## 2021-03-21 NOTE — Assessment & Plan Note (Signed)
Needs life long xarelto 20 mg. Cont.

## 2021-03-21 NOTE — Assessment & Plan Note (Signed)
Lab Results  Component Value Date   LDLCALC 98 10/05/2019   Unclear why he is not on cholesterol medication. Will reassess if and recommend.

## 2021-03-21 NOTE — Assessment & Plan Note (Signed)
Recheck Hgba1c today. Cont metformin 500 mg bid. Encouraged diabetic diet.

## 2021-03-21 NOTE — Patient Instructions (Addendum)
                  Additional Team Members        Waymon Budge, MD Consulting Physician (Pulmonary Disease) Phone: [phone] Fax: [phone] Email: [email or web address]  [address] [place] [address] 03/21/2021 - -  [address] [place] [address] address for Dr. Maple Hudson  Call Dr. Maple Hudson with pulmonology for follow-up If Trelegy works and you want a refill let me know  #High blood pressure - increase Losartan 50 mg - continue to monitor at home - if consistently >150/90

## 2021-03-21 NOTE — Chronic Care Management (AMB) (Signed)
  Chronic Care Management   Outreach Note  03/21/2021 Name: [name] MRN: [id number] DOB: 1960/09/24  Dustin Koch is a 60 y.o. year old male who is a primary care patient of Selena Batten, Chryl Heck, MD. I reached out to Dustin Koch by phone today in response to a referral sent by Dustin Koch's PCP Lynnda Child, MD     An unsuccessful telephone outreach was attempted today. The patient was referred to the case management team for assistance with care management and care coordination.   Follow Up Plan: A HIPAA compliant phone message was left for the patient providing contact information and requesting a return call.  If patient returns call to provider office, please advise to call Embedded Care Management Care Guide Dustin Koch at [phone]  Burman Nieves, CCMA Care Guide, Embedded Care Coordination [facility]  Care Management  Direct Dial: [phone]

## 2021-03-21 NOTE — Assessment & Plan Note (Signed)
Stable on lyrica 300 mg BID. Cont this

## 2021-03-22 LAB — LIPID PANEL
Cholesterol: 190 mg/dL (ref 0–200)
HDL: 52.7 mg/dL (ref >39.00)
LDL Cholesterol: 105 mg/dL — ABNORMAL HIGH (ref 0–99)
NonHDL: 137.17
Total CHOL/HDL Ratio: 4
Triglycerides: 161 mg/dL — ABNORMAL HIGH (ref 0.0–149.0)
VLDL: 32.2 mg/dL (ref 0.0–40.0)

## 2021-03-22 LAB — COMPREHENSIVE METABOLIC PANEL
ALT: 24 U/L (ref 0–53)
AST: 15 U/L (ref 0–37)
Albumin: 4.1 g/dL (ref 3.5–5.2)
Alkaline Phosphatase: 88 U/L (ref 39–117)
BUN: 18 mg/dL (ref 6–23)
CO2: 24 mEq/L (ref 19–32)
Calcium: 9.3 mg/dL (ref 8.4–10.5)
Chloride: 105 mEq/L (ref 96–112)
Creatinine, Ser: 1.01 mg/dL (ref 0.40–1.50)
GFR: 81.06 mL/min (ref >60.00)
Glucose, Bld: 201 mg/dL — ABNORMAL HIGH (ref 70–99)
Potassium: 4.5 mEq/L (ref 3.5–5.1)
Sodium: 138 mEq/L (ref 135–145)
Total Bilirubin: 0.3 mg/dL (ref 0.2–1.2)
Total Protein: 7.3 g/dL (ref 6.0–8.3)

## 2021-03-22 LAB — HEMOGLOBIN A1C: Hgb A1c MFr Bld: 8.3 % — ABNORMAL HIGH (ref 4.6–6.5)

## 2021-03-23 ENCOUNTER — Telehealth: Payer: Self-pay | Admitting: *Deleted

## 2021-03-23 NOTE — Chronic Care Management (AMB) (Signed)
  Chronic Care Management   Note  03/23/2021 Name: Dustin Koch MRN: [id number] DOB: 1960-09-24  Dustin Koch is a 60 y.o. year old male who is a primary care patient of Einar Pheasant, Jobe Marker, MD. I reached out to Dustin Koch by phone today in response to a referral sent by Dustin Koch PCP Dustin Noe, MD     Dustin Koch was given information about Chronic Care Management services today including:  CCM service includes personalized support from designated clinical staff supervised by his physician, including individualized plan of care and coordination with other care providers 24/7 contact phone numbers for assistance for urgent and routine care needs. Service will only be billed when office clinical staff spend 20 minutes or more in a month to coordinate care. Only one practitioner may furnish and bill the service in a calendar month. The patient may stop CCM services at any time (effective at the end of the month) by phone call to the office staff. The patient will be responsible for cost sharing (co-pay) of up to 20% of the service fee (after annual deductible is met).  Patient agreed to services and verbal consent obtained.   Follow up plan: Telephone appointment with care management team member scheduled for: 04/03/2021  Dustin Koch, [place], [place] Management  Direct Dial: [phone]

## 2021-03-23 NOTE — Chronic Care Management (AMB) (Signed)
OPENED IN ERROR/DUPLICATE 

## 2021-03-29 ENCOUNTER — Other Ambulatory Visit: Payer: Self-pay

## 2021-03-29 ENCOUNTER — Other Ambulatory Visit: Payer: Self-pay | Admitting: Family Medicine

## 2021-03-29 DIAGNOSIS — G2581 Restless legs syndrome: Secondary | ICD-10-CM

## 2021-03-29 DIAGNOSIS — G629 Polyneuropathy, unspecified: Secondary | ICD-10-CM

## 2021-03-29 DIAGNOSIS — J441 Chronic obstructive pulmonary disease with (acute) exacerbation: Secondary | ICD-10-CM

## 2021-03-29 DIAGNOSIS — E1149 Type 2 diabetes mellitus with other diabetic neurological complication: Secondary | ICD-10-CM

## 2021-03-29 DIAGNOSIS — Z7901 Long term (current) use of anticoagulants: Secondary | ICD-10-CM

## 2021-03-29 DIAGNOSIS — J449 Chronic obstructive pulmonary disease, unspecified: Secondary | ICD-10-CM

## 2021-03-29 DIAGNOSIS — E785 Hyperlipidemia, unspecified: Secondary | ICD-10-CM

## 2021-03-29 DIAGNOSIS — I10 Essential (primary) hypertension: Secondary | ICD-10-CM

## 2021-03-29 MED ORDER — TIOTROPIUM BROMIDE MONOHYDRATE 18 MCG IN CAPS
18.0000 ug | ORAL_CAPSULE | Freq: Every day | RESPIRATORY_TRACT | 3 refills | Status: DC
Start: 2021-03-29 — End: 2022-02-20

## 2021-03-29 MED ORDER — ROPINIROLE HCL 0.5 MG PO TABS: 0.5000 mg | ORAL_TABLET | Freq: Every day | ORAL | 3 refills | Status: DC

## 2021-03-29 MED ORDER — FLUTICASONE-SALMETEROL 250-50 MCG/ACT IN AEPB: INHALATION_SPRAY | RESPIRATORY_TRACT | 1 refills | Status: DC

## 2021-03-29 MED ORDER — IPRATROPIUM-ALBUTEROL 0.5-2.5 (3) MG/3ML IN SOLN
3.0000 mL | Freq: Four times a day (QID) | RESPIRATORY_TRACT | 0 refills | Status: DC | PRN
Start: 2021-03-29 — End: 2022-02-20

## 2021-03-29 MED ORDER — RIVAROXABAN 20 MG PO TABS: 20.0000 mg | ORAL_TABLET | Freq: Every day | ORAL | 3 refills | Status: DC

## 2021-03-29 MED ORDER — LOSARTAN POTASSIUM 50 MG PO TABS: 50.0000 mg | ORAL_TABLET | Freq: Every day | ORAL | 3 refills | Status: DC

## 2021-03-29 MED ORDER — METFORMIN HCL 500 MG PO TABS: 1000.0000 mg | ORAL_TABLET | Freq: Two times a day (BID) | ORAL | 3 refills | Status: DC

## 2021-03-29 MED ORDER — ATORVASTATIN CALCIUM 10 MG PO TABS: 10.0000 mg | ORAL_TABLET | Freq: Every day | ORAL | 3 refills | Status: DC

## 2021-03-29 MED ORDER — PREGABALIN 300 MG PO CAPS
300.0000 mg | ORAL_CAPSULE | Freq: Two times a day (BID) | ORAL | 1 refills | Status: DC
Start: 2021-03-29 — End: 2022-02-20

## 2021-03-29 MED ORDER — ATORVASTATIN CALCIUM 10 MG PO TABS
10.0000 mg | ORAL_TABLET | Freq: Every day | ORAL | 3 refills | Status: DC
Start: 2021-03-29 — End: 2021-03-29

## 2021-03-29 NOTE — Progress Notes (Signed)
Statin medication and increase dose of metformin sent to pharmacy  Please advise pt to increase as follows:   Week 1: Take 1000 mg (2 tablets) in the morning and 500 mg in the evening with food Week 2: Take 1000 mg (2 tablets) in the morning and evening with food  Return in 3 months for an appointment.

## 2021-03-29 NOTE — Progress Notes (Signed)
Spoke to pt and reiterated new metformin instructions as well as the new statin rx sent in. [facility] and [facility] of [facility]s need to be sent to [facility]. I rerouted all new meds and meds that were filled on 03/21/21 to [facility].

## 2021-04-03 ENCOUNTER — Ambulatory Visit: Payer: 59 | Admitting: *Deleted

## 2021-04-03 DIAGNOSIS — J441 Chronic obstructive pulmonary disease with (acute) exacerbation: Secondary | ICD-10-CM

## 2021-04-03 DIAGNOSIS — J449 Chronic obstructive pulmonary disease, unspecified: Secondary | ICD-10-CM

## 2021-04-03 DIAGNOSIS — E1149 Type 2 diabetes mellitus with other diabetic neurological complication: Secondary | ICD-10-CM

## 2021-04-03 DIAGNOSIS — Z560 Unemployment, unspecified: Secondary | ICD-10-CM

## 2021-04-03 NOTE — Chronic Care Management (AMB) (Signed)
Chronic Care Management    Clinical Social Work Note  04/03/2021 Name: LEVERN PITTER MRN: [id number] DOB: 28-Feb-1961  [name] is a 60 y.o. year old male who is a primary care patient of Selena Batten, Chryl Heck, MD. The CCM team was consulted to assist the patient with chronic disease management and/or care coordination needs related to: [facility] , [employer], and Financial Difficulties related to loss of job .   Engaged with patient by telephone for initial visit in response to provider referral for social work chronic care management and care coordination services.   Consent to Services:  The patient was given information about Chronic Care Management services, agreed to services, and gave verbal consent prior to initiation of services.  Please see initial visit note for detailed documentation.   Patient agreed to services and consent obtained.   Assessment: Review of patient past medical history, allergies, medications, and health status, including review of relevant consultants reports was performed today as part of a comprehensive evaluation and provision of chronic care management and care coordination services.     SDOH (Social Determinants of Health) assessments and interventions performed:    Advanced Directives Status: Not addressed in this encounter.  CCM Care Plan  No Known Allergies  Outpatient Encounter Medications as of 04/03/2021  Medication Sig   albuterol (VENTOLIN HFA) 108 (90 Base) MCG/ACT inhaler Inhale 2 puffs into the lungs every 6 (six) hours as needed for wheezing or shortness of breath.   atorvastatin (LIPITOR) 10 MG tablet Take 1 tablet (10 mg total) by mouth daily.   azithromycin (ZITHROMAX) 250 MG tablet Take 2 tablets (500 mg) today. Then take 1 tablet daily for the next 4 days.   fluticasone-salmeterol (ADVAIR) 250-50 MCG/ACT AEPB INHALE 1 PUFF INTO THE LUNGS TWICE A DAY   glucose blood test strip Use to test blood sugar daily.    ipratropium-albuterol (DUONEB) 0.5-2.5 (3) MG/3ML SOLN Take 3 mLs by nebulization every 6 (six) hours as needed.   losartan (COZAAR) 50 MG tablet Take 1 tablet (50 mg total) by mouth daily.   metFORMIN (GLUCOPHAGE) 500 MG tablet Take 2 tablets (1,000 mg total) by mouth 2 (two) times daily with a meal.   OXYGEN Inhale into the lungs. 2 liters as needed   pregabalin (LYRICA) 300 MG capsule Take 1 capsule (300 mg total) by mouth 2 (two) times daily.   rivaroxaban (XARELTO) 20 MG TABS tablet Take 1 tablet (20 mg total) by mouth daily with supper. (Patient not taking: Reported on 04/03/2021)   rOPINIRole (REQUIP) 0.5 MG tablet Take 1 tablet (0.5 mg total) by mouth at bedtime.   tiotropium (SPIRIVA) 18 MCG inhalation capsule Place 1 capsule (18 mcg total) into inhaler and inhale daily. (Patient not taking: Reported on 04/03/2021)   No facility-administered encounter medications on file as of 04/03/2021.    Patient Active Problem List   Diagnosis Date Noted   COPD with acute exacerbation (HCC) 11/17/2020   Sleep apnea 10/05/2019   Family history of colon cancer in father 10/05/2019   Chronic anticoagulation 09/12/2018   Dyslipidemia 05/21/2017   DM (diabetes mellitus), type 2 with neurological complications (HCC) 06/25/2016   Chronic pain disorder 06/25/2016   BMI 35.0-35.9,adult 06/25/2016   Peripheral polyneuropathy 06/25/2016   COPD mixed type (HCC) 03/10/2015   Snoring 03/10/2015   Essential hypertension 03/10/2015   GERD (gastroesophageal reflux disease) 03/10/2015   Cough 03/10/2015   Pain in both feet 03/10/2015   Pedal edema 03/10/2015  Fungal infection 03/10/2015   Tobacco use 03/10/2015   Allergic rhinitis 04/08/2013   Bronchitis 04/08/2013   Fatigue 04/08/2013   Nicotine abuse 04/08/2013   INSOMNIA 02/21/2010   Nodule of middle lobe of right lung 09/09/2009   History of pulmonary embolism 08/29/2009   Acute thromboembolism of deep veins of lower extremity (HCC) 08/29/2009    ASTHMA 06/28/2008    Conditions to be addressed/monitored: COPD; Financial constraints and Medication procurement  Care Plan : LCSW Plan of Care  Updates made by Buck Mam, LCSW since 04/03/2021 12:00 AM     Problem: Social and Functional Barriers   Priority: High     Goal: Assist patient with securing financial, vocational, medical support to promote overall better health of QOL   Start Date: 04/03/2021  Expected End Date: 05/04/2021  This Visit's Progress: On track  Priority: High  Note:   Current barriers:   Patient in need of assistance with connecting to community resources for Financial constraints related to unemployment, Medication procurement, and Lacks knowledge of community resources Acknowledges deficits with meeting this unmet need Patient is unable to independently navigate community resource options without care coordination support Clinical Goals:  patient will work with SW to address concerns related to community support patient will work with Care Guide to address needs related to [employer], financial stress, etc patient will follow up with Pharmacist on our team* as directed by SW Clinical Interventions:  CSW spoke with pt who 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 Pt has been struggling with finances, etc and is seeking  help. Per pt, he has applied for unemployment but has not received anything. CSW suggested he apply for SS Disability if he feels he cannot return to any sort of work.  "I am on oxygen most of the time".  He reports moving here to take care of his mother who died in 2021-11-28from [place] where his wife still lives.  He admits to not being able to pay for all his RX because of cost and is seeking help. "The spirivia and the xerelto are each over $1000 a month".  Pt reports there are about 3 meds he cannot afford to fill; and that he told the drug store to hold on filling them because of cost. Will refer  pt to Pharmacist in [facility] office to reach out to pt for help.   Pt appreciative of help-  Collaboration with Lynnda Child, MD regarding development and update of comprehensive plan of care as evidenced by provider attestation and co-signature Inter-disciplinary care team collaboration (see longitudinal plan of care) Assessment of needs, barriers , agencies contacted, as well as how impacting  Review various resources, discussed options and provided patient information about Financial constraints related to unable to pay for RX, other expenses due to job loss, Medication procurement, and Lacks knowledge of community resources Collaborated with appropriate clinical care team members regarding patient needs Financial constraints, Medication procurement, and Lacks knowledge of community resources, COPD Referral placed via [employer] Patient interviewed and appropriate assessments performed Referred patient to community resources care guide team for assistance with resources to aide in his financial stress due to job loss Provided patient with information about SS Disability, unemployment,etc Collaborated with primary care provider re: RX cost concerns and need for Pharmacist referral/support, samples?, etc Other interventions provided: Solution-Focused Strategies, Active listening / Reflection utilized , Emotional Supportive Provided, and Problem Solving /Task Center  Patient Goals:  -  apply for disability (online or go to [employer] office nearby) - follow-up on any referrals for help I am given - make a list of family or friends that I can call     Follow Up Plan: SW will follow up with patient by phone over the next 3 weeks        Follow Up Plan: SW will follow up with patient by phone over the next 3 weeks and [employer] Guide will reach out to patient for assistance with resources to aide in his unemployment/financial stress.      Reece Levy MSW, LCSW Licensed  Clinical Social Worker [facility] [place] [phone]

## 2021-04-03 NOTE — Patient Instructions (Signed)
Visit Information   PATIENT GOALS:   Goals Addressed             This Visit's Progress    Find Help in My Community       Timeframe:  Long-Range Goal Priority:  High Start Date:          04/03/21                   Expected End Date:       06/02/21                Follow Up Date 04/21/2021  -apply for disability (online or go to [employer] office nearby) - follow-up on any referrals for help I am given - make a list of family or friends that I can call    Why is this important?   Knowing how and where to find help for yourself or family in your neighborhood and community is an important skill.  You will want to take some steps to learn how.    Notes:         Consent to CCM Services: Mr. Perrow was given information about Chronic Care Management services today including:  CCM service includes personalized support from designated clinical staff supervised by his physician, including individualized plan of care and coordination with other care providers 24/7 contact phone numbers for assistance for urgent and routine care needs. Service will only be billed when office clinical staff spend 20 minutes or more in a month to coordinate care. Only one practitioner may furnish and bill the service in a calendar month. The patient may stop CCM services at any time (effective at the end of the month) by phone call to the office staff. The patient will be responsible for cost sharing (co-pay) of up to 20% of the service fee (after annual deductible is met).  Patient agreed to services and verbal consent obtained.   The patient verbalized understanding of instructions, educational materials, and care plan provided today and declined offer to receive copy of patient instructions, educational materials, and care plan.   Telephone follow up appointment with care management team member scheduled for:04/21/21  Eduard Clos MSW, LCSW Licensed Clinical Social Worker [place] [phone]   CLINICAL CARE PLAN: Patient Care Plan: LCSW Plan of Care     Problem Identified: Social and Functional Barriers   Priority: High     Goal: Assist patient with securing financial, vocational, medical support to promote overall better health of QOL   Start Date: 04/03/2021  Expected End Date: 05/04/2021  This Visit's Progress: On track  Priority: High  Note:   Current barriers:   Patient in need of assistance with connecting to community resources for Financial constraints related to unemployment, Medication procurement, and Lacks knowledge of community resources Acknowledges deficits with meeting this unmet need Patient is unable to independently navigate community resource options without care coordination support Clinical Goals:  patient will work with SW to address concerns related to community support patient will work with Care Guide to address needs related to [employer], financial stress, etc patient will follow up with Pharmacist on our team* as directed by SW Clinical Interventions:  CSW spoke with pt who 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 Pt has been struggling with finances, etc and is seeking  help. Per pt, he has applied for unemployment but has not received anything. CSW suggested he apply for SS  Disability if he feels he cannot return to any sort of work.  "I am on oxygen most of the time".  He reports moving here to take care of his mother who died in 2020-08-02, from [place] where his wife still lives.  He admits to not being able to pay for all his RX because of cost and is seeking help. "The spirivia and the xerelto are each over $1000 a month".  Pt reports there are about 3 meds he cannot afford to fill; and that he told the drug store to hold on filling them because of cost. Will refer pt to Pharmacist in [facility] office to reach out to pt for help.   Pt appreciative of help-  Collaboration with Lesleigh Noe, MD regarding development and update of comprehensive plan of care as evidenced by provider attestation and co-signature Inter-disciplinary care team collaboration (see longitudinal plan of care) Assessment of needs, barriers , agencies contacted, as well as how impacting  Review various resources, discussed options and provided patient information about Financial constraints related to unable to pay for RX, other expenses due to job loss, Medication procurement, and Lacks knowledge of community resources Collaborated with appropriate clinical care team members regarding patient needs Financial constraints, Medication procurement, and Lacks knowledge of community resources, COPD Referral placed via [place] [employer] Patient interviewed and appropriate assessments performed Referred patient to community resources care guide team for assistance with resources to aide in his financial stress due to job loss Provided patient with information about SS Disability, unemployment,etc Collaborated with primary care provider re: RX cost concerns and need for Pharmacist referral/support, samples?, etc Other interventions provided: Solution-Focused Strategies, Active listening / Reflection utilized , Emotional Supportive Provided, and Problem [place]  Patient Goals:  -apply for disability (online or go to [place] office nearby) - follow-up on any referrals for help I am given - make a list of family or friends that I can call     Follow Up Plan: SW will follow up with patient by phone over the next 3 weeks

## 2021-04-05 ENCOUNTER — Other Ambulatory Visit: Payer: Self-pay | Admitting: *Deleted

## 2021-04-05 ENCOUNTER — Telehealth: Payer: Self-pay

## 2021-04-05 DIAGNOSIS — Z86711 Personal history of pulmonary embolism: Secondary | ICD-10-CM

## 2021-04-05 DIAGNOSIS — J449 Chronic obstructive pulmonary disease, unspecified: Secondary | ICD-10-CM

## 2021-04-05 NOTE — Telephone Encounter (Signed)
   Telephone encounter was:  Successful.  04/05/2021 Name: Dustin Koch MRN: [id number] DOB: [birth year]  Dustin Koch is a 60 y.o. year old male who is a primary care patient of Selena Batten, Chryl Heck, MD . The community resource team was consulted for assistance with  disability and unemployment.  Care guide performed the following interventions: Spoke with patient about filing for disability and unemployment.  Patient's wife called with email address [email or web address].  I will email information within the next 7 days.  Follow Up Plan:  Care guide will follow up with patient by phone over the next 7 days.  Germain Koopmann, AAS Paralegal, [facility] Care Guide  Embedded Care Coordination [place]  Care Management  [address] [place], [place] [address] ??[email or web address]  ?? [phone]   [email or web address]

## 2021-04-05 NOTE — Telephone Encounter (Signed)
Pt left v/m that pt has [facility]. And needs different med for Xarelto (cost to pt is $399.00 and Spiriva cost to pt $1565.00. I spoke with pt and pt will ck with ins co to see if has a comparable substitute for these 2 meds. Pt wanted to know since he had hx of PE and DVT could pt ;just take three ASA and that be the same thing. I advised pt no and it might be necessary to do PA or exception request to ins. Co. Pt will cb after speaks with ins co. Sending note to [facility] CMA and Dr Selena Batten.

## 2021-04-06 ENCOUNTER — Telehealth: Payer: Self-pay

## 2021-04-06 NOTE — Telephone Encounter (Signed)
Contacted pharmacy. They have his correct insurance and reported its most likely a high deductible. Patient reports he called insurance and they report they will not cover Xarelto, to ask PCP for generic. I let patient know there is no generic alternative for Xarelto. Will look into coupon options and call patient back today.  Phil Dopp, PharmD Clinical Pharmacist [facility] at [facility] [phone]

## 2021-04-06 NOTE — Telephone Encounter (Signed)
   Telephone encounter was:  Successful.  04/06/2021 Name: Dustin Koch MRN: [id number] DOB: Jan 11, 1961  Dustin Koch is a 60 y.o. year old male who is a primary care patient of Dustin Batten, Dustin Heck, MD . The community resource team was consulted for assistance with  unemployment and disability information.  Care guide performed the following interventions: Received email response from patient's wife confirming she has received resources sent for unemployment and disability.    Follow Up Plan:  No further follow up planned at this time. The patient has been provided with needed resources.  Dustin Koch, AAS Paralegal, [facility] Care Guide  Embedded Care Coordination [place]  Care Management  [address] [place], [place] [address] ??[email or web address]  ?? [phone]   [email or web address]

## 2021-04-06 NOTE — Telephone Encounter (Signed)
Will await insurance support. Will also route to pharmacist (referral just placed) for support.

## 2021-04-06 NOTE — Telephone Encounter (Addendum)
Applied for [employer]. [facility] and provided information. Card should be ran secondary to his primary insurance. Pharmacist attempted to run the card but was unable to process it. She was very busy and said she would "work on it". Will follow up tomorrow.  BIN - [id number] Elza Rafter ID - [id number] PCN - [id number]  Phil Dopp, PharmD Clinical Pharmacist [facility] at [facility] [phone]

## 2021-04-10 ENCOUNTER — Other Ambulatory Visit: Payer: Self-pay

## 2021-04-10 DIAGNOSIS — Z7901 Long term (current) use of anticoagulants: Secondary | ICD-10-CM

## 2021-04-10 MED ORDER — RIVAROXABAN 20 MG PO TABS: 20.0000 mg | ORAL_TABLET | Freq: Every day | ORAL | 3 refills | Status: DC

## 2021-04-10 NOTE — Telephone Encounter (Signed)
Dustin Koch, would you mind sending Xarelto to [facility] so we can try the coupon? [facility] has not been able to successfully process it.  Phil Dopp, PharmD Clinical Pharmacist [facility] at [facility] [phone]

## 2021-04-10 NOTE — Progress Notes (Signed)
Contacted [facility] and spoke with pharmacist. He ran card and price was $289.48 for 30 day supply with coupon card and insurance. For 90 day supply cost was $800. Patient has $15,000 deductible. Advised pharmacy to fill it but would let him know if patient did not want.    Phil Dopp, CPP notified  Jomarie Longs, [facility] Clinical Pharmacy Assistant [phone]

## 2021-04-11 NOTE — Telephone Encounter (Signed)
Patient states he will go without Spiriva for now. There are coupons available for Spiriva if he is interested he may contact me.  Phil Dopp, PharmD Clinical Pharmacist [facility] at [facility] [phone]

## 2021-04-11 NOTE — Telephone Encounter (Addendum)
Patient has been enrolled in [employer] and will receive the medication for $85/30 day supply from [facility] pharmacy [phone]). This will expire September 02, 2021. He may re-enroll December 02, 2021. Patient is aware and will contact me with any concerns. I recommend he review insurance plan options in the meantime.   Phil Dopp, PharmD Clinical Pharmacist [facility] at [facility] [phone]

## 2021-04-21 ENCOUNTER — Ambulatory Visit: Payer: 59 | Admitting: *Deleted

## 2021-04-21 DIAGNOSIS — Z560 Unemployment, unspecified: Secondary | ICD-10-CM

## 2021-04-21 DIAGNOSIS — E1149 Type 2 diabetes mellitus with other diabetic neurological complication: Secondary | ICD-10-CM

## 2021-04-21 DIAGNOSIS — J441 Chronic obstructive pulmonary disease with (acute) exacerbation: Secondary | ICD-10-CM

## 2021-04-22 NOTE — Chronic Care Management (AMB) (Signed)
Chronic Care Management    Clinical Social Work Note  04/22/2021 Name: Dustin Koch MRN: [id number] DOB: 03-01-1961  Dustin Koch is a 60 y.o. year old male who is a primary care patient of Dustin Koch, Dustin Heck, MD. The CCM team was consulted to assist the patient with chronic disease management and/or care coordination needs related to: [employer]  and Financial Difficulties related to unemployment .   Engaged with patient by telephone for follow up visit in response to provider referral for social work chronic care management and care coordination services.   Consent to Services:  The patient was given information about Chronic Care Management services, agreed to services, and gave verbal consent prior to initiation of services.  Please see initial visit note for detailed documentation.   Patient agreed to services and consent obtained.   Assessment: Review of patient past medical history, allergies, medications, and health status, including review of relevant consultants reports was performed today as part of a comprehensive evaluation and provision of chronic care management and care coordination services.     SDOH (Social Determinants of Health) assessments and interventions performed:    Advanced Directives Status: Not addressed in this encounter.  CCM Care Plan  No Known Allergies  Outpatient Encounter Medications as of 04/21/2021  Medication Sig   albuterol (VENTOLIN HFA) 108 (90 Base) MCG/ACT inhaler Inhale 2 puffs into the lungs every 6 (six) hours as needed for wheezing or shortness of breath.   atorvastatin (LIPITOR) 10 MG tablet Take 1 tablet (10 mg total) by mouth daily.   azithromycin (ZITHROMAX) 250 MG tablet Take 2 tablets (500 mg) today. Then take 1 tablet daily for the next 4 days.   fluticasone-salmeterol (ADVAIR) 250-50 MCG/ACT AEPB INHALE 1 PUFF INTO THE LUNGS TWICE A DAY   glucose blood test strip Use to test blood sugar daily.   ipratropium-albuterol (DUONEB)  0.5-2.5 (3) MG/3ML SOLN Take 3 mLs by nebulization every 6 (six) hours as needed.   losartan (COZAAR) 50 MG tablet Take 1 tablet (50 mg total) by mouth daily.   metFORMIN (GLUCOPHAGE) 500 MG tablet Take 2 tablets (1,000 mg total) by mouth 2 (two) times daily with a meal.   OXYGEN Inhale into the lungs. 2 liters as needed   pregabalin (LYRICA) 300 MG capsule Take 1 capsule (300 mg total) by mouth 2 (two) times daily.   rivaroxaban (XARELTO) 20 MG TABS tablet Take 1 tablet (20 mg total) by mouth daily with supper.   rOPINIRole (REQUIP) 0.5 MG tablet Take 1 tablet (0.5 mg total) by mouth at bedtime.   tiotropium (SPIRIVA) 18 MCG inhalation capsule Place 1 capsule (18 mcg total) into inhaler and inhale daily. (Patient not taking: Reported on 04/03/2021)   No facility-administered encounter medications on file as of 04/21/2021.    Patient Active Problem List   Diagnosis Date Noted   COPD with acute exacerbation (HCC) 11/17/2020   Sleep apnea 10/05/2019   Family history of colon cancer in father 10/05/2019   Chronic anticoagulation 09/12/2018   Dyslipidemia 05/21/2017   DM (diabetes mellitus), type 2 with neurological complications (HCC) 06/25/2016   Chronic pain disorder 06/25/2016   BMI 35.0-35.9,adult 06/25/2016   Peripheral polyneuropathy 06/25/2016   COPD mixed type (HCC) 03/10/2015   Snoring 03/10/2015   Essential hypertension 03/10/2015   GERD (gastroesophageal reflux disease) 03/10/2015   Cough 03/10/2015   Pain in both feet 03/10/2015   Pedal edema 03/10/2015   Fungal infection 03/10/2015   Tobacco use 03/10/2015  Allergic rhinitis 04/08/2013   Bronchitis 04/08/2013   Fatigue 04/08/2013   Nicotine abuse 04/08/2013   INSOMNIA 02/21/2010   Nodule of middle lobe of right lung 09/09/2009   History of pulmonary embolism 08/29/2009   Acute thromboembolism of deep veins of lower extremity (HCC) 08/29/2009   ASTHMA 06/28/2008    Conditions to be addressed/monitored: COPD, Anxiety,  and Depression; Financial constraints related to unemployment and Medication procurement  Care Plan : LCSW Plan of Care  Updates made by Buck Mam, LCSW since 04/22/2021 12:00 AM     Problem: Social and Functional Barriers   Priority: High     Long-Range Goal: Assist patient with securing financial, vocational, medical support to promote overall better health of QOL   Start Date: 04/03/2021  Expected End Date: 06/02/2021  This Visit's Progress: On track  Recent Progress: On track  Priority: High  Note:   Current barriers:   Patient in need of assistance with connecting to community resources for Financial constraints related to unemployment, Medication procurement, and Lacks knowledge of community resources Acknowledges deficits with meeting this unmet need Patient is unable to independently navigate community resource options without care coordination support Clinical Goals:  patient will work with SW to address concerns related to community support patient will work with Care Guide to address needs related to [employer], financial stress, etc patient will follow up with Pharmacist on our team* as directed by SW Clinical Interventions:  CSW spoke with pt who says his wife has assisted him with applying for Unemployment "three times and it's still pending". He has not applied for disability yet and had some questions about how to do this; county of residence, [place].  CSW offered to e-mail his wife info on how to apply online. Also, will e-mail them info on Voc Rehab which may be an option for him also.  Pt pleased to have gotten some help from Pharmacist with cost of meds; he is still concerned about this given he was advised it was only for 3 months on one of them and "one is still $800 and I can't do that". He has been using his nebulizer which he reports is helping also.  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 Pt has been struggling with finances, etc and is seeking  help. Per pt, he has applied for unemployment but has not received anything. CSW suggested he apply for SS Disability if he feels he cannot return to any sort of work.  "I am on oxygen most of the time".  He reports moving here to take care of his mother who died in 2021-12-07from [place] where his wife still lives.  He admits to not being able to pay for all his RX because of cost and is seeking help. "The spirivia and the xerelto are each over $1000 a month".  Pt reports there are about 3 meds he cannot afford to fill; and that he told the drug store to hold on filling them because of cost. Will refer pt to Pharmacist in [facility] office to reach out to pt for help.   Pt appreciative of help-  Collaboration with Lynnda Child, MD regarding development and update of comprehensive plan of care as evidenced by provider attestation and co-signature Inter-disciplinary care team collaboration (see longitudinal plan of care) Assessment of needs, barriers , agencies contacted, as well as how impacting  Review  various resources, discussed options and provided patient information about Financial constraints related to unable to pay for RX, other expenses due to job loss, Medication procurement, and Lacks knowledge of Soil scientist with appropriate clinical care team members regarding patient needs Financial constraints, Medication procurement, and Lacks knowledge of community resources, COPD Referral placed via [facility] Patient interviewed and appropriate assessments performed Referred patient to community resources care guide team for assistance with resources to aide in his financial stress due to job loss Provided patient with information about SS Disability, unemployment,etc Collaborated with primary care provider re: RX cost concerns and need for Pharmacist referral/support, samples?, etc Other interventions  provided: Solution-Focused Strategies, Active listening / Reflection utilized , Emotional Supportive Provided, and Problem Solving /Task Center  Patient Goals:  -apply for disability (online or go to [employer] office nearby) -follow up on pending unemployment application - follow-up on any referrals for help I am given - make a list of family or friends that I can call     Follow Up Plan: SW will follow up with patient by phone 05/15/21      Follow Up Plan: Appointment scheduled for SW follow up with client by phone on: 05/15/21      Reece Levy MSW, LCSW Licensed Clinical Social Worker [facility] [employer] [facility] [phone]

## 2021-04-22 NOTE — Patient Instructions (Signed)
Visit Information  PATIENT GOALS:  Goals Addressed   None     The patient verbalized understanding of instructions, educational materials, and care plan provided today and declined offer to receive copy of patient instructions, educational materials, and care plan.   Telephone follow up appointment with care management team member scheduled for:05/15/21  Reece Levy MSW, LCSW Licensed Clinical Social Worker [facility] [phone]

## 2021-04-26 ENCOUNTER — Telehealth: Payer: Self-pay

## 2021-04-26 NOTE — Telephone Encounter (Signed)
Received fax from [facility] pharmacy. Pt is enrolled in an assistance program for his xarelto. Information was filled in and put in providers box for signature. Will fax back after signed.

## 2021-04-27 NOTE — Telephone Encounter (Signed)
Paperwork signed.

## 2021-04-28 NOTE — Telephone Encounter (Signed)
Form faxed and received confirmation of fax. Form sent to scan.

## 2021-05-15 ENCOUNTER — Ambulatory Visit: Payer: 59 | Admitting: *Deleted

## 2021-05-15 DIAGNOSIS — Z560 Unemployment, unspecified: Secondary | ICD-10-CM

## 2021-05-15 DIAGNOSIS — E1149 Type 2 diabetes mellitus with other diabetic neurological complication: Secondary | ICD-10-CM

## 2021-05-15 DIAGNOSIS — J441 Chronic obstructive pulmonary disease with (acute) exacerbation: Secondary | ICD-10-CM

## 2021-05-18 NOTE — Chronic Care Management (AMB) (Signed)
Chronic Care Management    Clinical Social Work Note  05/18/2021 Name: Dustin Koch MRN: [id number] DOB: 1961/08/18  Dustin Koch is a 60 y.o. year old male who is a primary care patient of Selena Batten, Chryl Heck, MD. The CCM team was consulted to assist the patient with chronic disease management and/or care coordination needs related to: [employer] .   Engaged with patient by telephone for follow up visit in response to provider referral for social work chronic care management and care coordination services.   Consent to Services:  The patient was given information about Chronic Care Management services, agreed to services, and gave verbal consent prior to initiation of services.  Please see initial visit note for detailed documentation.   Patient agreed to services and consent obtained.   Assessment: Review of patient past medical history, allergies, medications, and health status, including review of relevant consultants reports was performed today as part of a comprehensive evaluation and provision of chronic care management and care coordination services.     SDOH (Social Determinants of Health) assessments and interventions performed:    Advanced Directives Status: Not addressed in this encounter.  CCM Care Plan  No Known Allergies  Outpatient Encounter Medications as of 05/15/2021  Medication Sig   albuterol (VENTOLIN HFA) 108 (90 Base) MCG/ACT inhaler Inhale 2 puffs into the lungs every 6 (six) hours as needed for wheezing or shortness of breath.   atorvastatin (LIPITOR) 10 MG tablet Take 1 tablet (10 mg total) by mouth daily.   azithromycin (ZITHROMAX) 250 MG tablet Take 2 tablets (500 mg) today. Then take 1 tablet daily for the next 4 days.   fluticasone-salmeterol (ADVAIR) 250-50 MCG/ACT AEPB INHALE 1 PUFF INTO THE LUNGS TWICE A DAY   glucose blood test strip Use to test blood sugar daily.   ipratropium-albuterol (DUONEB) 0.5-2.5 (3) MG/3ML SOLN Take 3 mLs by nebulization  every 6 (six) hours as needed.   losartan (COZAAR) 50 MG tablet Take 1 tablet (50 mg total) by mouth daily.   metFORMIN (GLUCOPHAGE) 500 MG tablet Take 2 tablets (1,000 mg total) by mouth 2 (two) times daily with a meal.   OXYGEN Inhale into the lungs. 2 liters as needed   pregabalin (LYRICA) 300 MG capsule Take 1 capsule (300 mg total) by mouth 2 (two) times daily.   rivaroxaban (XARELTO) 20 MG TABS tablet Take 1 tablet (20 mg total) by mouth daily with supper.   rOPINIRole (REQUIP) 0.5 MG tablet Take 1 tablet (0.5 mg total) by mouth at bedtime.   tiotropium (SPIRIVA) 18 MCG inhalation capsule Place 1 capsule (18 mcg total) into inhaler and inhale daily. (Patient not taking: Reported on 04/03/2021)   No facility-administered encounter medications on file as of 05/15/2021.    Patient Active Problem List   Diagnosis Date Noted   COPD with acute exacerbation (HCC) 11/17/2020   Sleep apnea 10/05/2019   Family history of colon cancer in father 10/05/2019   Chronic anticoagulation 09/12/2018   Dyslipidemia 05/21/2017   DM (diabetes mellitus), type 2 with neurological complications (HCC) 06/25/2016   Chronic pain disorder 06/25/2016   BMI 35.0-35.9,adult 06/25/2016   Peripheral polyneuropathy 06/25/2016   COPD mixed type (HCC) 03/10/2015   Snoring 03/10/2015   Essential hypertension 03/10/2015   GERD (gastroesophageal reflux disease) 03/10/2015   Cough 03/10/2015   Pain in both feet 03/10/2015   Pedal edema 03/10/2015   Fungal infection 03/10/2015   Tobacco use 03/10/2015   Allergic rhinitis 04/08/2013  Bronchitis 04/08/2013   Fatigue 04/08/2013   Nicotine abuse 04/08/2013   INSOMNIA 02/21/2010   Nodule of middle lobe of right lung 09/09/2009   History of pulmonary embolism 08/29/2009   Acute thromboembolism of deep veins of lower extremity (HCC) 08/29/2009   ASTHMA 06/28/2008    Conditions to be addressed/monitored: CAD, COPD, and Anxiety; Financial constraints related to  unemployment and denied SS Disability  Care Plan : LCSW Plan of Care  Updates made by Buck Mam, LCSW since 05/18/2021 12:00 AM     Problem: Social and Functional Barriers   Priority: High     Long-Range Goal: Assist patient with securing financial, vocational, medical support to promote overall better health of QOL   Start Date: 04/03/2021  Expected End Date: 07/03/2021  This Visit's Progress: On track  Recent Progress: On track  Priority: High  Note:   Current barriers:   Patient in need of assistance with connecting to community resources for Financial constraints related to unemployment, Medication procurement, and Lacks knowledge of community resources Acknowledges deficits with meeting this unmet need Patient is unable to independently navigate community resource options without care coordination support Clinical Goals:  patient will work with SW to address concerns related to community support patient will work with Care Guide to address needs related to [employer], financial stress, etc patient will follow up with Pharmacist on our team* as directed by SW Clinical Interventions:  CSW spoke with pt who says he has contacted a Clinical research associate for assistance with appealing SS Disability determination. He also plans to consider getting some other  quotes.  He is on his way to see his wife- CSW reminded him about other resources emailed to wife. He was able to get the [facility] for $80.monthhas assisted him with applying for Unemployment "three times and it's still pending".   Pt appreciative of help-  Collaboration with Lynnda Child, MD regarding development and update of comprehensive plan of care as evidenced by provider attestation and co-signature Inter-disciplinary care team collaboration (see longitudinal plan of care) Assessment of needs, barriers , agencies contacted, as well as how impacting  Review various resources, discussed options and provided patient information  about Financial constraints related to unable to pay for RX, other expenses due to job loss, Medication procurement, and Lacks knowledge of community resources Collaborated with appropriate clinical care team members regarding patient needs Financial constraints, Medication procurement, and Lacks knowledge of community resources, COPD Patient interviewed and appropriate assessments performed Referred patient to community resources care guide team for assistance with resources to aide in his financial stress due to job loss Provided patient with information about SS Disability, unemployment,etc Collaborated with primary care provider re: RX cost concerns and need for Pharmacist referral/support, samples?, etc Other interventions provided: Solution-Focused Strategies, Active listening / Reflection utilized , Emotional Supportive Provided, and Problem Solving /Task Center  Patient Goals:  -consider appealing disability  - follow-up on any referrals for help I am given - make a list of family or friends that I can call   Follow Up Plan: SW will follow up with patient by phone 06/14/21      Follow Up Plan: SW will follow up with patient by phone over the next 30 days      Reece Levy MSW, LCSW Licensed Clinical Social Worker [facility] [phone]

## 2021-05-18 NOTE — Patient Instructions (Signed)
Visit Information  PATIENT GOALS:  Goals Addressed             This Visit's Progress    Find Help in My Community       Timeframe:  Long-Range Goal Priority:  High Start Date:          04/03/21                   Expected End Date:      07/03/21                Follow Up Date 06/14/21 -consider appealing disability  - follow-up on any referrals for help I am given - make a list of family or friends that I can call    Why is this important?   Knowing how and where to find help for yourself or family in your neighborhood and community is an important skill.  You will want to take some steps to learn how.    Notes:         The patient verbalized understanding of instructions, educational materials, and care plan provided today and declined offer to receive copy of patient instructions, educational materials, and care plan.   Telephone follow up appointment with care management team member scheduled for:06/14/21  Reece Levy MSW, LCSW Licensed Clinical Social Worker [facility] [phone]

## 2021-06-14 ENCOUNTER — Telehealth: Payer: 59

## 2021-06-21 ENCOUNTER — Ambulatory Visit: Payer: 59 | Admitting: Family Medicine

## 2021-06-29 ENCOUNTER — Ambulatory Visit: Payer: 59 | Admitting: Family Medicine

## 2021-07-06 ENCOUNTER — Ambulatory Visit: Payer: 59 | Admitting: Family Medicine

## 2021-08-14 ENCOUNTER — Ambulatory Visit: Payer: 59 | Admitting: *Deleted

## 2021-08-14 DIAGNOSIS — Z86711 Personal history of pulmonary embolism: Secondary | ICD-10-CM

## 2021-08-14 DIAGNOSIS — J449 Chronic obstructive pulmonary disease, unspecified: Secondary | ICD-10-CM

## 2021-08-14 NOTE — Chronic Care Management (AMB) (Signed)
Chronic Care Management    Clinical Social Work Note  08/14/2021 Name: Dustin Koch MRN: [id number] DOB: 1961-01-03  Dustin Koch is a 60 y.o. year old male who is a primary care patient of Selena Batten, Chryl Heck, MD. The CCM team was consulted to assist the patient with chronic disease management and/or care coordination needs related to: [employer] .   Engaged with patient by telephone for follow up visit in response to provider referral for social work chronic care management and care coordination services.   Consent to Services:  The patient was given information about Chronic Care Management services, agreed to services, and gave verbal consent prior to initiation of services.  Please see initial visit note for detailed documentation.   Patient agreed to services and consent obtained.   Assessment: Review of patient past medical history, allergies, medications, and health status, including review of relevant consultants reports was performed today as part of a comprehensive evaluation and provision of chronic care management and care coordination services.     SDOH (Social Determinants of Health) assessments and interventions performed:    Advanced Directives Status: Not addressed in this encounter.  CCM Care Plan  No Known Allergies  Outpatient Encounter Medications as of 08/14/2021  Medication Sig   albuterol (VENTOLIN HFA) 108 (90 Base) MCG/ACT inhaler Inhale 2 puffs into the lungs every 6 (six) hours as needed for wheezing or shortness of breath.   atorvastatin (LIPITOR) 10 MG tablet Take 1 tablet (10 mg total) by mouth daily.   azithromycin (ZITHROMAX) 250 MG tablet Take 2 tablets (500 mg) today. Then take 1 tablet daily for the next 4 days.   fluticasone-salmeterol (ADVAIR) 250-50 MCG/ACT AEPB INHALE 1 PUFF INTO THE LUNGS TWICE A DAY   glucose blood test strip Use to test blood sugar daily.   ipratropium-albuterol (DUONEB) 0.5-2.5 (3) MG/3ML SOLN Take 3 mLs by  nebulization every 6 (six) hours as needed.   losartan (COZAAR) 50 MG tablet Take 1 tablet (50 mg total) by mouth daily.   metFORMIN (GLUCOPHAGE) 500 MG tablet Take 2 tablets (1,000 mg total) by mouth 2 (two) times daily with a meal.   OXYGEN Inhale into the lungs. 2 liters as needed   pregabalin (LYRICA) 300 MG capsule Take 1 capsule (300 mg total) by mouth 2 (two) times daily.   rivaroxaban (XARELTO) 20 MG TABS tablet Take 1 tablet (20 mg total) by mouth daily with supper.   rOPINIRole (REQUIP) 0.5 MG tablet Take 1 tablet (0.5 mg total) by mouth at bedtime.   tiotropium (SPIRIVA) 18 MCG inhalation capsule Place 1 capsule (18 mcg total) into inhaler and inhale daily. (Patient not taking: Reported on 04/03/2021)   No facility-administered encounter medications on file as of 08/14/2021.    Patient Active Problem List   Diagnosis Date Noted   COPD with acute exacerbation (HCC) 11/17/2020   Sleep apnea 10/05/2019   Family history of colon cancer in father 10/05/2019   Chronic anticoagulation 09/12/2018   Dyslipidemia 05/21/2017   DM (diabetes mellitus), type 2 with neurological complications (HCC) 06/25/2016   Chronic pain disorder 06/25/2016   BMI 35.0-35.9,adult 06/25/2016   Peripheral polyneuropathy 06/25/2016   COPD mixed type (HCC) 03/10/2015   Snoring 03/10/2015   Essential hypertension 03/10/2015   GERD (gastroesophageal reflux disease) 03/10/2015   Cough 03/10/2015   Pain in both feet 03/10/2015   Pedal edema 03/10/2015   Fungal infection 03/10/2015   Tobacco use 03/10/2015   Allergic rhinitis 04/08/2013  Bronchitis 04/08/2013   Fatigue 04/08/2013   Nicotine abuse 04/08/2013   INSOMNIA 02/21/2010   Nodule of middle lobe of right lung 09/09/2009   History of pulmonary embolism 08/29/2009   Acute thromboembolism of deep veins of lower extremity (HCC) 08/29/2009   ASTHMA 06/28/2008    Conditions to be addressed/monitored: COPD; Financial constraints related to  unemployment/disability pending and Lacks knowledge of community resource:    Care Plan : LCSW Plan of Care  Updates made by Buck Mam, LCSW since 08/14/2021 12:00 AM     Problem: Social and Functional Barriers   Priority: High     Long-Range Goal: Assist patient with securing financial, vocational, medical support to promote overall better health of QOL   Start Date: 04/03/2021  Expected End Date: 10/02/2021  This Visit's Progress: On track  Recent Progress: On track  Priority: High  Note:   Current barriers:   Patient in need of assistance with connecting to community resources for Financial constraints related to unemployment, Medication procurement, and Lacks knowledge of community resources Acknowledges deficits with meeting this unmet need Patient is unable to independently navigate community resource options without care coordination support Clinical Goals:  patient will work with SW to address concerns related to community support patient will work with Care Guide to address needs related to [employer], financial stress, etc patient will follow up with Pharmacist on our team* as directed by SW Clinical Interventions:  CSW spoke with pt who says he is awaiting word from the lawyer/firm he contacted for assistance with appealing SS Disability determination.  His wife is now caring for a granchild- 13yo and pt asked questions about rights/custody,e tc- suggested they speak to someone with DSS for guidance.  Pt anticipates hearing something from the Attorney about his appeal in the next 60 days- will followup in January for further updates/plans- Pt appreciative of help-  Collaboration with Lynnda Child, MD regarding development and update of comprehensive plan of care as evidenced by provider attestation and co-signature Inter-disciplinary care team collaboration (see longitudinal plan of care) Assessment of needs, barriers , agencies contacted, as well as how impacting   Review various resources, discussed options and provided patient information about Financial constraints related to unable to pay for RX, other expenses due to job loss, Medication procurement, and Lacks knowledge of community resources Collaborated with appropriate clinical care team members regarding patient needs Financial constraints, Medication procurement, and Lacks knowledge of community resources, COPD Patient interviewed and appropriate assessments performed Referred patient to community resources care guide team for assistance with resources to aide in his financial stress due to job loss Provided patient with information about SS Disability, unemployment,etc Collaborated with primary care provider re: RX cost concerns and need for Pharmacist referral/support, samples?, etc Other interventions provided: Solution-Focused Strategies, Active listening / Reflection utilized , Emotional Supportive Provided, and Problem Solving /Task Center  Patient Goals:  -consider appealing disability  - follow-up on any referrals for help I am given - make a list of family or friends that I can call   Follow Up Plan: SW will follow up with patient by phone 09/14/21      Follow Up Plan: Appointment scheduled for SW follow up with client by phone on: 09/14/21      Reece Levy MSW, LCSW Licensed Clinical Social Worker [facility] [phone]

## 2021-08-14 NOTE — Patient Instructions (Signed)
Visit Information  Thank you for taking time to visit with me today. Please don't hesitate to contact me if I can be of assistance to you before our next scheduled telephone appointment.  Following are the goals we discussed today:  (Copy and paste patient goals from clinical care plan here)  Our next appointment is by telephone on 09/14/21    Please call the care guide team at [phone] if you need to cancel or reschedule your appointment.   If you are experiencing a Mental Health or Behavioral Health Crisis or need someone to talk to, please call the [place] National Suicide Prevention Lifeline: 248-364-4669 or TTY: 603-655-3244 TTY 228-607-9273) to talk to a trained counselor call 1-800-273-TALK (toll free, 24 hour hotline) call 911   The patient verbalized understanding of instructions, educational materials, and care plan provided today and declined offer to receive copy of patient instructions, educational materials, and care plan.   Reece Levy MSW, LCSW Licensed Clinical Social Worker [facility] [place] [phone]

## 2021-09-14 ENCOUNTER — Telehealth: Payer: 59

## 2021-10-20 ENCOUNTER — Ambulatory Visit: Payer: 59 | Admitting: *Deleted

## 2021-10-20 DIAGNOSIS — Z86711 Personal history of pulmonary embolism: Secondary | ICD-10-CM

## 2021-10-20 DIAGNOSIS — J441 Chronic obstructive pulmonary disease with (acute) exacerbation: Secondary | ICD-10-CM

## 2021-10-20 NOTE — Patient Instructions (Addendum)
Visit Information  Thank you for taking time to visit with me today. Please don't hesitate to contact me if I can be of assistance to you before our next scheduled telephone appointment.  Following are the goals we discussed today:   -follow up on the appeal to disability  - follow-up on any referrals for help I am given - make a list of family or friends that I can call   Our next appointment is by telephone on 11/27/21 at 11am  Please call the care guide team at [phone] if you need to cancel or reschedule your appointment.   If you are experiencing a Mental Health or Behavioral Health Crisis or need someone to talk to, please call the [place] National Suicide Prevention Lifeline: (680) 415-1107 or TTY: (539)543-7910 TTY (404)766-5882) to talk to a trained counselor call 1-800-273-TALK (toll free, 24 hour hotline) go to [facility] [address] [place] [phone]) call 911   Patient verbalizes understanding of instructions and care plan provided today and agrees to view in MyChart. Active MyChart status confirmed with patient.    Reece Levy MSW, LCSW Licensed Clinical Social Worker [facility]   [phone]

## 2021-10-20 NOTE — Chronic Care Management (AMB) (Signed)
Chronic Care Management    Clinical Social Work Note  10/20/2021 Name: Dustin Koch MRN: [id number] DOB: [birth year]  Dustin Koch is a 61 y.o. year old male who is a primary care patient of Einar Pheasant, Jobe Marker, MD. The CCM team was consulted to assist the patient with chronic disease management and/or care coordination needs related to: [employer] , Mental Health Counseling and Resources, and Financial Difficulties related to unemployment .   Engaged with patient by telephone for follow up visit in response to provider referral for social work chronic care management and care coordination services.   Consent to Services:  The patient was given information about Chronic Care Management services, agreed to services, and gave verbal consent prior to initiation of services.  Please see initial visit note for detailed documentation.   Patient agreed to services and consent obtained.   Assessment: Review of patient past medical history, allergies, medications, and health status, including review of relevant consultants reports was performed today as part of a comprehensive evaluation and provision of chronic care management and care coordination services.     SDOH (Social Determinants of Health) assessments and interventions performed:    Advanced Directives Status: Not addressed in this encounter.  CCM Care Plan  No Known Allergies  Outpatient Encounter Medications as of 10/20/2021  Medication Sig   albuterol (VENTOLIN HFA) 108 (90 Base) MCG/ACT inhaler Inhale 2 puffs into the lungs every 6 (six) hours as needed for wheezing or shortness of breath.   atorvastatin (LIPITOR) 10 MG tablet Take 1 tablet (10 mg total) by mouth daily.   azithromycin (ZITHROMAX) 250 MG tablet Take 2 tablets (500 mg) today. Then take 1 tablet daily for the next 4 days.   fluticasone-salmeterol (ADVAIR) 250-50 MCG/ACT AEPB INHALE 1 PUFF INTO THE LUNGS TWICE A DAY   glucose blood test strip Use to test blood sugar  daily.   ipratropium-albuterol (DUONEB) 0.5-2.5 (3) MG/3ML SOLN Take 3 mLs by nebulization every 6 (six) hours as needed.   losartan (COZAAR) 50 MG tablet Take 1 tablet (50 mg total) by mouth daily.   metFORMIN (GLUCOPHAGE) 500 MG tablet Take 2 tablets (1,000 mg total) by mouth 2 (two) times daily with a meal.   OXYGEN Inhale into the lungs. 2 liters as needed   pregabalin (LYRICA) 300 MG capsule Take 1 capsule (300 mg total) by mouth 2 (two) times daily.   rivaroxaban (XARELTO) 20 MG TABS tablet Take 1 tablet (20 mg total) by mouth daily with supper.   rOPINIRole (REQUIP) 0.5 MG tablet Take 1 tablet (0.5 mg total) by mouth at bedtime.   tiotropium (SPIRIVA) 18 MCG inhalation capsule Place 1 capsule (18 mcg total) into inhaler and inhale daily. (Patient not taking: Reported on 04/03/2021)   No facility-administered encounter medications on file as of 10/20/2021.    Patient Active Problem List   Diagnosis Date Noted   COPD with acute exacerbation ([place]) 11/17/2020   Sleep apnea 10/05/2019   Family history of colon cancer in father 10/05/2019   Chronic anticoagulation 09/12/2018   Dyslipidemia 05/21/2017   DM (diabetes mellitus), type 2 with neurological complications ([place]) 0000000   Chronic pain disorder 06/25/2016   BMI 35.0-35.9,adult 06/25/2016   Peripheral polyneuropathy 06/25/2016   COPD mixed type ([place]) 03/10/2015   Snoring 03/10/2015   Essential hypertension 03/10/2015   GERD (gastroesophageal reflux disease) 03/10/2015   Cough 03/10/2015   Pain in both feet 03/10/2015   Pedal edema 03/10/2015   Fungal infection 03/10/2015  Tobacco use 03/10/2015   Allergic rhinitis 04/08/2013   Bronchitis 04/08/2013   Fatigue 04/08/2013   Nicotine abuse 04/08/2013   INSOMNIA 02/21/2010   Nodule of middle lobe of right lung 09/09/2009   History of pulmonary embolism 08/29/2009   Acute thromboembolism of deep veins of lower extremity ([place]) 08/29/2009   ASTHMA 06/28/2008    Conditions  to be addressed/monitored:  stress ; Financial constraints related to unemployment and pending disability determination  Care Plan : LCSW Plan of Care  Updates made by Deirdre Peer, LCSW since 10/20/2021 12:00 AM     Problem: Social and Functional Barriers   Priority: High     Long-Range Goal: Assist patient with securing financial, vocational, medical support to promote overall better health of QOL   Start Date: 04/03/2021  Expected End Date: 11/30/2021  This Visit's Progress: On track  Recent Progress: On track  Priority: High  Note:   Current barriers:   Patient in need of assistance with connecting to community resources for Financial constraints related to unemployment, Medication procurement, and Lacks knowledge of community resources Acknowledges deficits with meeting this unmet need Patient is unable to independently navigate community resource options without care coordination support Clinical Goals:  patient will work with SW to address concerns related to community support patient will work with Care Guide to address needs related to [employer], financial stress, etc patient will follow up with Pharmacist on our team* as directed by SW Clinical Interventions:  CSW spoke with pt who says he is still awaiting word from the law firm he contacted for assistance with appealing SS Disability determination.  His wife is now caring for a granchild- 75yo and pt asked questions about rights/custody,e tc- suggested they speak to someone with DSS for guidance.   Collaboration with Lesleigh Noe, MD regarding development and update of comprehensive plan of care as evidenced by provider attestation and co-signature Inter-disciplinary care team collaboration (see longitudinal plan of care) Assessment of needs, barriers , agencies contacted, as well as how impacting  Review various resources, discussed options and provided patient information about Financial constraints related to  unable to pay for RX, other expenses due to job loss, Medication procurement, and Lacks knowledge of community resources Collaborated with appropriate clinical care team members regarding patient needs Financial constraints, Medication procurement, and Lacks knowledge of community resources, COPD Patient interviewed and appropriate assessments performed Referred patient to community resources care guide team for assistance with resources to aide in his financial stress due to job loss Provided patient with information about SS Disability, unemployment,etc Collaborated with primary care provider re: RX cost concerns and need for Pharmacist referral/support, samples?, etc Other interventions provided: Solution-Focused Strategies, Active listening / Reflection utilized , Emotional Supportive Provided, and Problem [place]  Patient Goals:  -consider appealing disability  - follow-up on any referrals for help I am given - make a list of family or friends that I can call   Follow Up Plan: SW will follow up with patient by phone 11/27/21      Follow Up Plan: Appointment scheduled for SW follow up with client by phone on: 11/27/21      Eduard Clos MSW, [place] Licensed Clinical Social Worker [place]   [phone]

## 2021-10-29 IMAGING — DX DG CHEST 2V
2 series · 2 of 2 positions shown · non-contrast
Comparison: 03/10/2015

CLINICAL DATA: COPD mixed-type

EXAM:
CHEST - 2 VIEW

[chest pa]
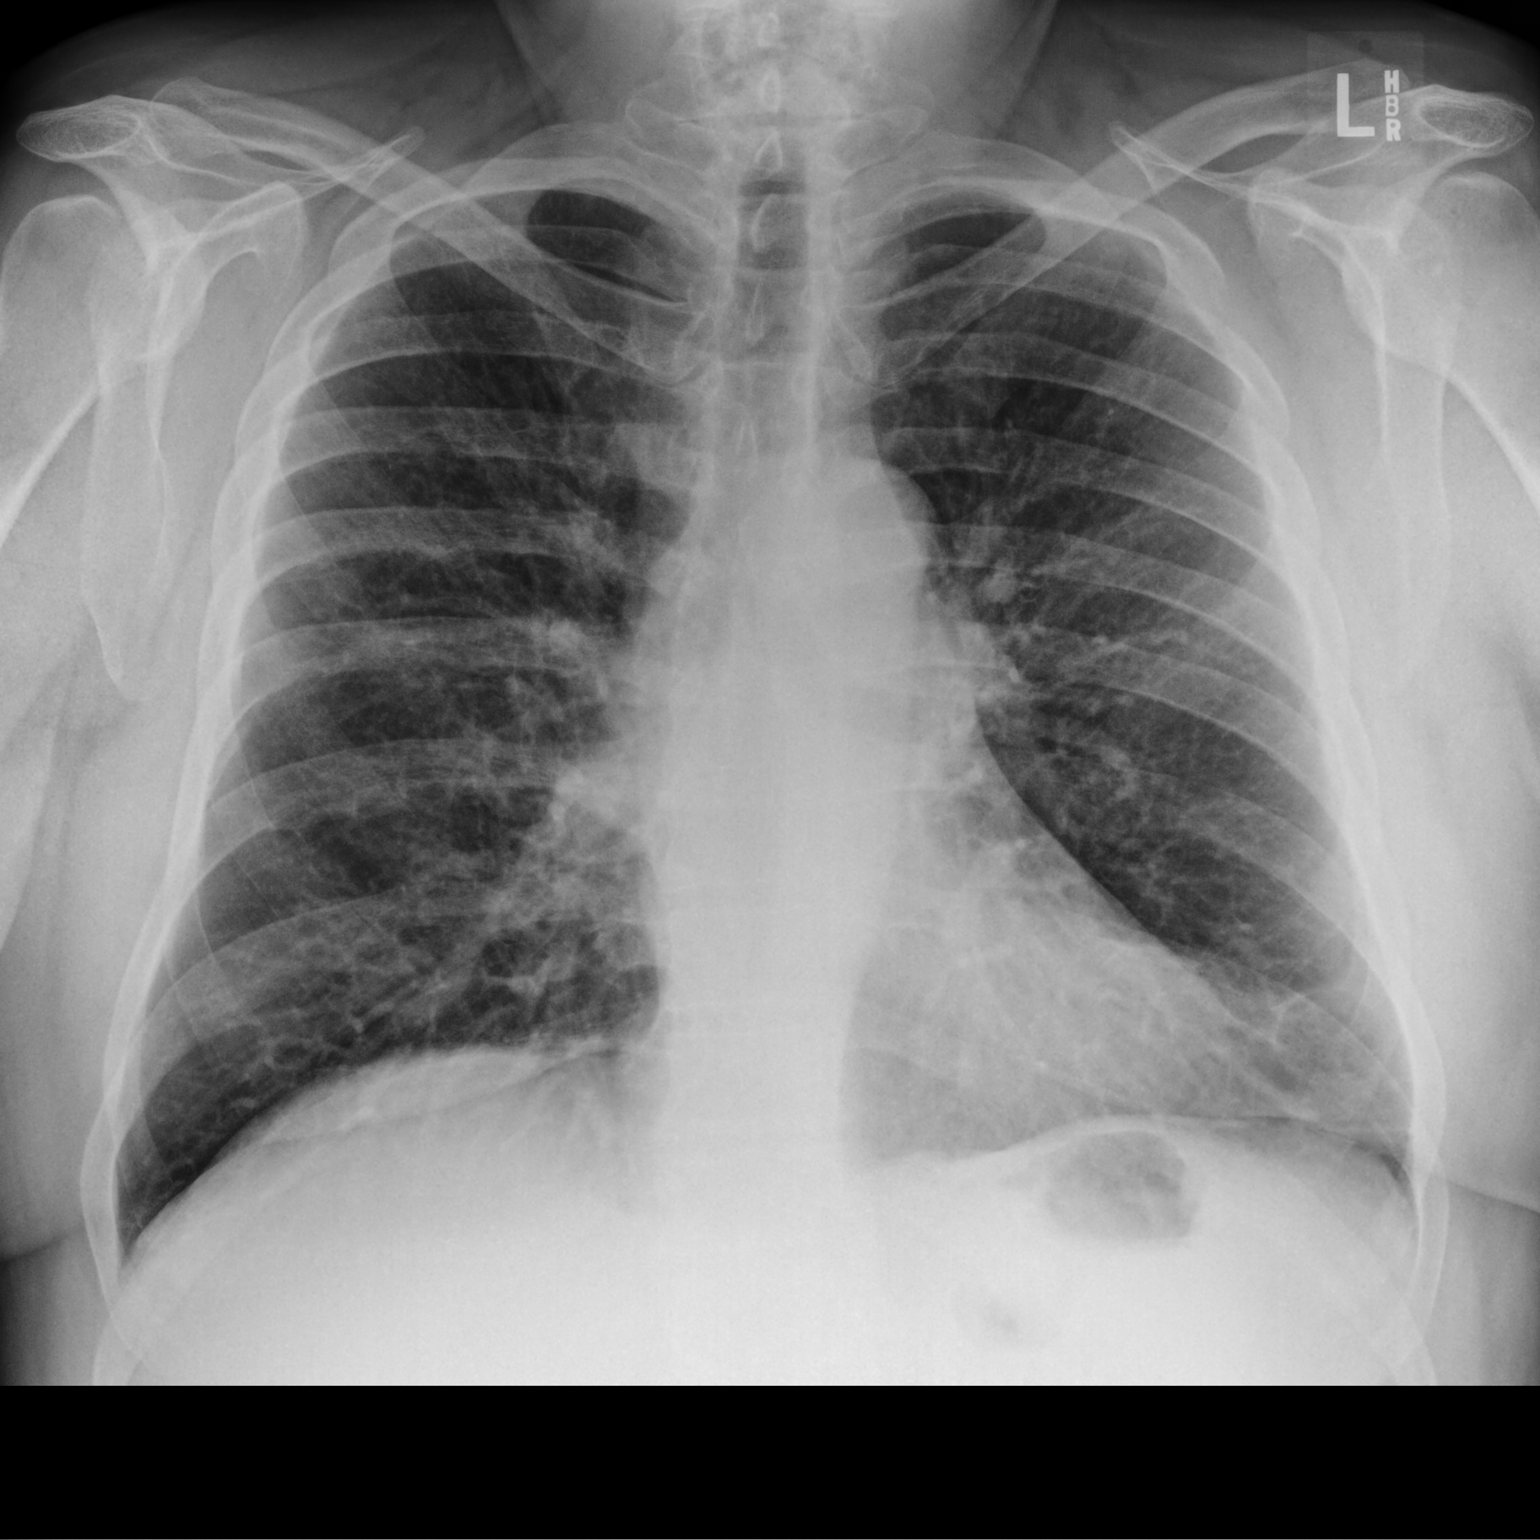

[chest lat]
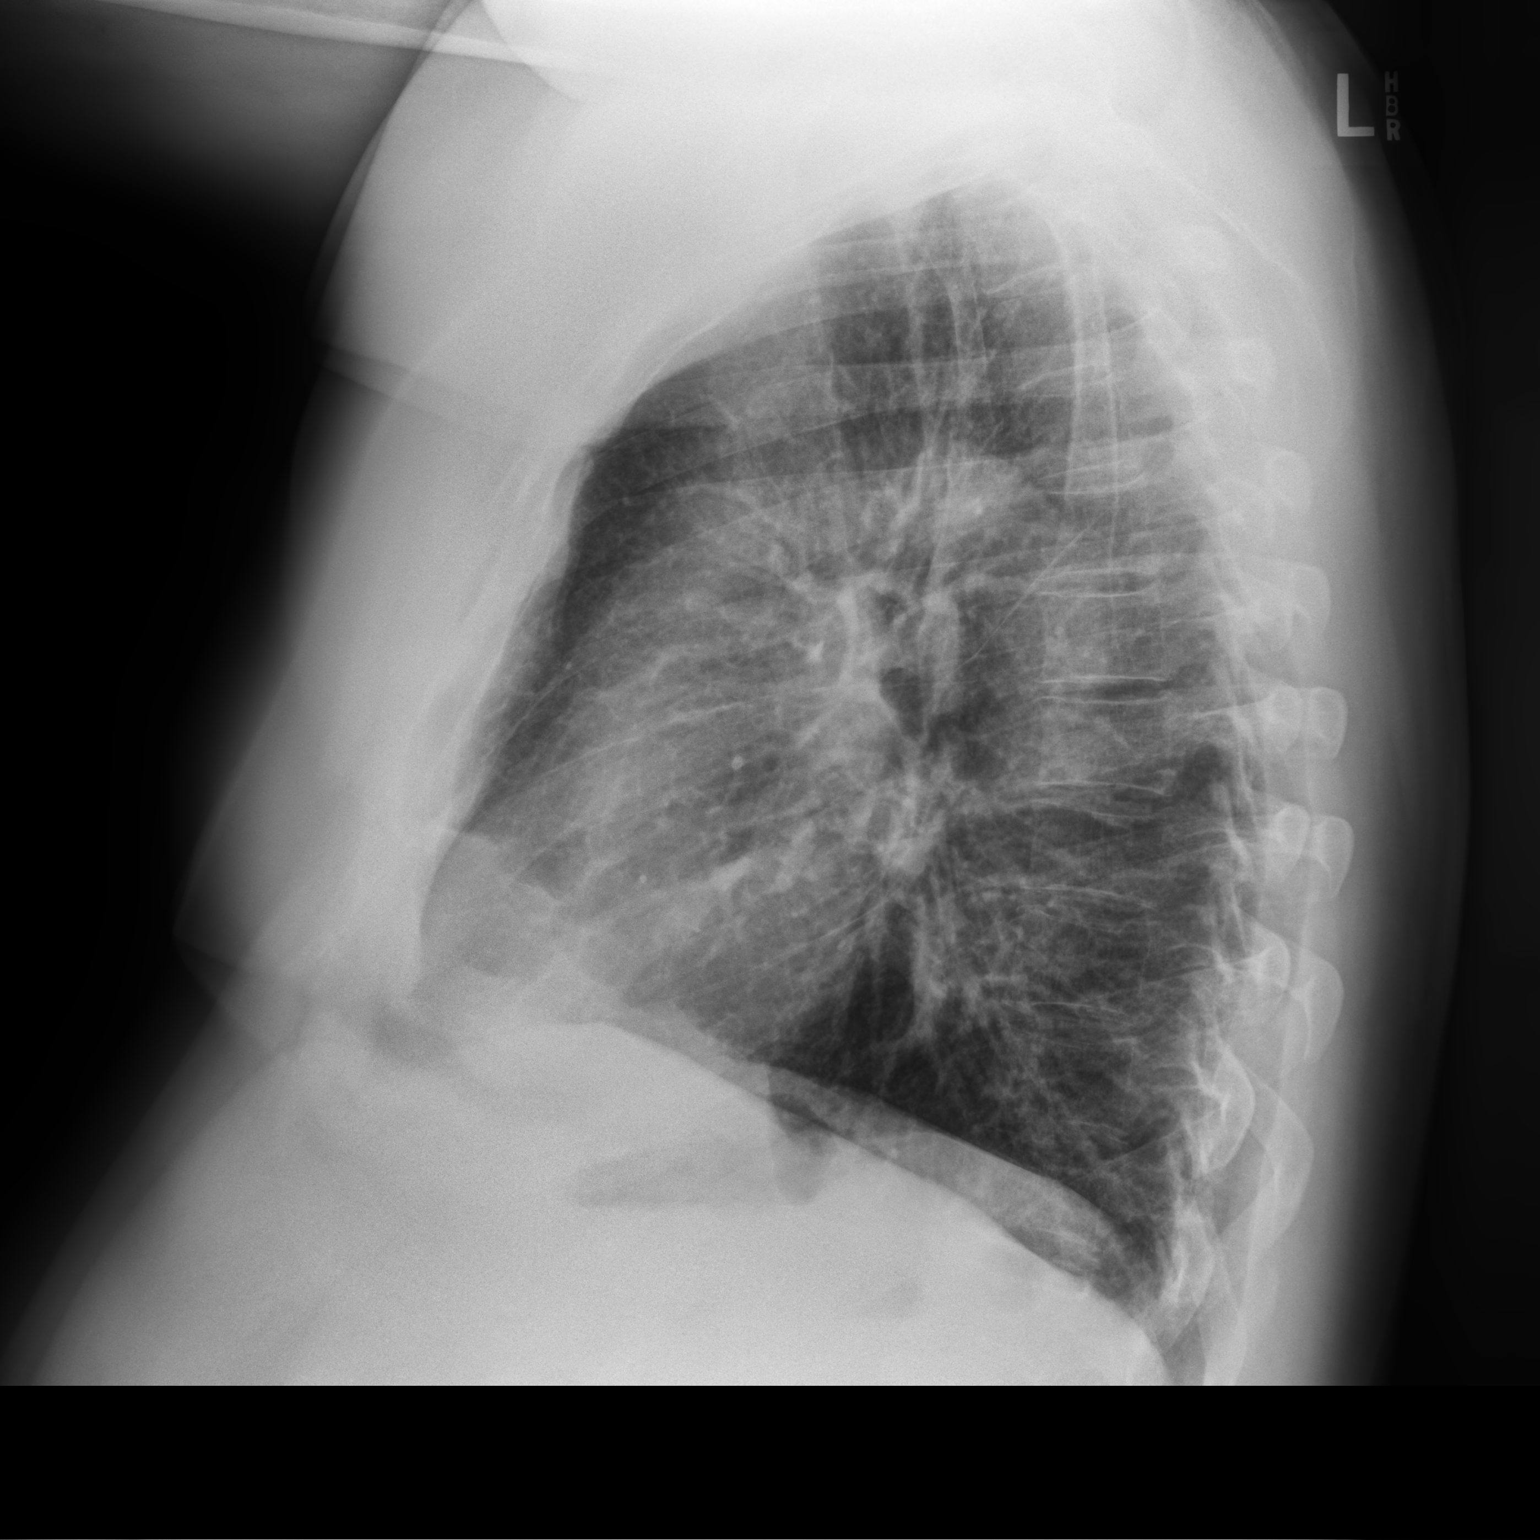

[2 of 2 positions shown; findings below may reference images not displayed]

FINDINGS: Pulmonary hyperinflation unchanged. Lung markings mildly prominent.
No focal infiltrate or effusion. Negative for heart failure or
edema. No mass lesion.
IMPRESSION: COPD with hyperinflation.  No superimposed acute abnormality.

## 2021-11-27 ENCOUNTER — Telehealth: Payer: 59

## 2021-11-28 ENCOUNTER — Telehealth: Payer: Self-pay | Admitting: *Deleted

## 2021-11-28 NOTE — Telephone Encounter (Signed)
?  Care Management  ? ?Follow Up Note ? ? ?11/28/2021 ?Name: Dustin Koch MRN: [id number] DOB: 06-18-1961 ? ? ?Referred by: Lesleigh Noe, MD ?Reason for referral : No chief complaint on file. ? ? ?An unsuccessful telephone outreach was attempted today. The patient was referred to the case management team for assistance with care management and care coordination.  ? ?Follow Up Plan: The care management team will reach out to the patient again over the next 10 days.  ?Eduard Clos MSW, LCSW ?Licensed Clinical Social Worker ?[place]   ?[phone] ?

## 2021-12-06 NOTE — Chronic Care Management (AMB) (Signed)
?  Care Management  ? ?Note ? ?12/06/2021 ?Name: Dustin Koch MRN: [id number] DOB: 1960/11/26 ? ?Dustin Koch is a 61 y.o. year old male who is a primary care patient of Lesleigh Noe, MD and is actively engaged with the care management team. I reached out to [name] by phone today to assist with re-scheduling a follow up visit with the Licensed Clinical Social Worker ? ?Follow up plan: ?Telephone appointment with care management team member scheduled for: 12/25/2021 ? ?Cherice Glennie, CCMA ?Care Guide, Embedded Care Coordination ?[place]  Care Management  ?Direct Dial: [phone] ? ? ?

## 2021-12-07 ENCOUNTER — Telehealth: Payer: Self-pay

## 2021-12-07 NOTE — Telephone Encounter (Signed)
Mychart message sent to pt giving him the medical records dept #.  ?

## 2021-12-07 NOTE — Telephone Encounter (Signed)
Patient called states that disability has tried to get records 3 times and not received. Wanted to follow up on that and see if we can send.  ? ?Would like call back to discuss.  ?

## 2021-12-25 ENCOUNTER — Telehealth: Payer: 59

## 2022-01-18 ENCOUNTER — Ambulatory Visit (INDEPENDENT_AMBULATORY_CARE_PROVIDER_SITE_OTHER): Payer: 59 | Admitting: Family Medicine

## 2022-01-18 VITALS — BP 132/70 | HR 60 | Temp 97.0°F | Ht 71.75 in | Wt 247.4 lb

## 2022-01-18 DIAGNOSIS — G629 Polyneuropathy, unspecified: Secondary | ICD-10-CM

## 2022-01-18 DIAGNOSIS — T7840XD Allergy, unspecified, subsequent encounter: Secondary | ICD-10-CM

## 2022-01-18 DIAGNOSIS — M79672 Pain in left foot: Secondary | ICD-10-CM

## 2022-01-18 DIAGNOSIS — T7840XA Allergy, unspecified, initial encounter: Secondary | ICD-10-CM | POA: Insufficient documentation

## 2022-01-18 DIAGNOSIS — M79671 Pain in right foot: Secondary | ICD-10-CM

## 2022-01-18 DIAGNOSIS — J449 Chronic obstructive pulmonary disease, unspecified: Secondary | ICD-10-CM

## 2022-01-18 MED ORDER — EPINEPHRINE 0.3 MG/0.3ML IJ SOAJ: 0.3000 mg | INTRAMUSCULAR | 1 refills | Status: AC | PRN

## 2022-01-18 NOTE — Assessment & Plan Note (Signed)
Suspect likely due to his neuropathy, however with early morning foot pain resulting in falls question other pathology.  Patient notes history of bone spurs in the heels, question for planter fasciitis however exam difficult given lack of sensation.  Appreciate podiatry support.

## 2022-01-18 NOTE — Assessment & Plan Note (Signed)
Dependent on oxygen.  He did not bring this to his to into the office today.  He started to have progressive worsening of labored breathing during our office visit without wearing any oxygen today.  Discussed importance of oxygen adherence.  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  He is disabled as a result of this.  Continue oxygen, Advair, as needed albuterol, and Spiriva.  Continue pulmonology follow-up.

## 2022-01-18 NOTE — Progress Notes (Signed)
Subjective:     Dustin Koch is a 61 y.o. male presenting for Hospitalization Follow-up (ED) and Fall (Friday morning. Hit head. )     HPI  #Fall - 3rd time he had fallen in the morning - when getting out of bed in the morning - burning feet in the morning - in the middle of the foot when he puts pressure - worse when he lays down at night - first pressure causes significant - lyrica does help the pain but still with symptoms - saw podiatry several years ago - symptoms worse over the last couple of years  #breathing difficulty - went to the ER with acute SOB - had sprayed herbicide on Friday - took amoxicillin on Saturday AM for an abscess - then could not breath 10 minutes late  - could not breath or speak - called EMS and was taken to the ER - lips/tongue swelling  - pressure in the ears - had significant wheezing  Did stop his losartan but he restarted this  No hx of allergy to amoxicillin  He thinks it was the herbicide   Copd - using oxygen - he left in the truck  Review of Systems  01/13/2022: ER - [facility] - SOB/Angioedema/anaphylaxis - pruritis, swelling of lips/tongue - given epi, solumedrol and O2 - told to stop losartan  Social History   Tobacco Use  Smoking Status Every Day   Packs/day: 1.00   Years: 40.00   Pack years: 40.00   Types: Cigarettes  Smokeless Tobacco Never  Tobacco Comments   1/2-1 pack per day         Objective:    BP Readings from Last 3 Encounters:  01/18/22 132/70  03/21/21 (!) 152/86  01/08/20 128/80   Wt Readings from Last 3 Encounters:  01/18/22 247 lb 6 oz (112.2 kg)  03/21/21 269 lb (122 kg)  01/08/20 279 lb 12.8 oz (126.9 kg)    BP 132/70   Pulse 60   Temp (!) 97 F (36.1 C) (Temporal)   Ht 5' 11.75" (1.822 m)   Wt 247 lb 6 oz (112.2 kg)   SpO2 97%   BMI 33.78 kg/m    Physical Exam Constitutional:      Appearance: Normal appearance. He is not ill-appearing or diaphoretic.  HENT:      Right Ear: External ear normal.     Left Ear: External ear normal.     Nose: Nose normal.  Eyes:     General: No scleral icterus.    Extraocular Movements: Extraocular movements intact.     Conjunctiva/sclera: Conjunctivae normal.  Cardiovascular:     Rate and Rhythm: Normal rate.  Pulmonary:     Effort: Pulmonary effort is normal. Tachypnea and prolonged expiration present.     Breath sounds: No decreased air movement. Wheezing present.     Comments: Labored breathing towards the end of the visit as not using home O2 Musculoskeletal:     Cervical back: Neck supple.     Comments: Bilateral flat feet. Significant decreased sensation.   Skin:    General: Skin is warm and dry.     Comments: Abrasion injury on the forehead. Large scab on the left hand  Neurological:     Mental Status: He is alert. Mental status is at baseline.  Psychiatric:        Mood and Affect: Mood normal.          Assessment & Plan:   Problem List  Items Addressed This Visit       Respiratory   COPD mixed type (HCC)    Dependent on oxygen.  He did not bring this to his to into the office today.  He started to have progressive worsening of labored breathing during our office visit without wearing any oxygen today.  Discussed importance of oxygen adherence.  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  He is disabled as a result of this.  Continue oxygen, Advair, as needed albuterol, and Spiriva.  Continue pulmonology follow-up.         Nervous and Auditory   Peripheral polyneuropathy    Persistent symptoms resulting in falls in the morning due to foot pain getting out of bed.  Continue Lyrica 300 mg twice daily, still with persistent symptoms though he notes worsening if he forgets a dose.  Discussed referral to podiatry to see if there is any other pathology in his foot that could explain his severe morning  symptoms.       Relevant Orders   Ambulatory referral to Podiatry     Other   Pain in both feet    Suspect likely due to his neuropathy, however with early morning foot pain resulting in falls question other pathology.  Patient notes history of bone spurs in the heels, question for planter fasciitis however exam difficult given lack of sensation.  Appreciate podiatry support.       Relevant Orders   Ambulatory referral to Podiatry   Allergic reaction - Primary    Reviewed recent ER visit with concern for angioedema versus anaphylaxis (sob, swelling, pruritis).  Patient had stopped losartan though he has restarted it since then with no return of any swelling of the lips.  Gust given exposure to herbicide this does seem like it may have been the cause of the allergic reaction.  Encouraged referral to allergy, but he declined at this time.  Does seem reasonable to try him on the losartan due to other complications.  He has been instructed to stop immediately if he develops any swelling or tingling in the lips.  EpiPen prescribed due to unclear cause for his reaction.  Discussed importance of going to the ER if he ends up using it.       Relevant Medications   EPINEPHrine 0.3 mg/0.3 mL IJ SOAJ injection     Return in about 4 weeks (around 02/15/2022) for diabetes and routine f/u - medical records.  Lynnda Child, MD

## 2022-01-18 NOTE — Patient Instructions (Addendum)
[facility] medical records dept can be reached at [phone].   You can also access all your visit notes through [facility] to continue losartan  If mouth swelling - stop immediately

## 2022-01-18 NOTE — Assessment & Plan Note (Signed)
Persistent symptoms resulting in falls in the morning due to foot pain getting out of bed.  Continue Lyrica 300 mg twice daily, still with persistent symptoms though he notes worsening if he forgets a dose.  Discussed referral to podiatry to see if there is any other pathology in his foot that could explain his severe morning symptoms.

## 2022-01-18 NOTE — Assessment & Plan Note (Signed)
Reviewed recent ER visit with concern for angioedema versus anaphylaxis (sob, swelling, pruritis).  Patient had stopped losartan though he has restarted it since then with no return of any swelling of the lips.  Gust given exposure to herbicide this does seem like it may have been the cause of the allergic reaction.  Encouraged referral to allergy, but he declined at this time.  Does seem reasonable to try him on the losartan due to other complications.  He has been instructed to stop immediately if he develops any swelling or tingling in the lips.  EpiPen prescribed due to unclear cause for his reaction.  Discussed importance of going to the ER if he ends up using it.

## 2022-01-26 ENCOUNTER — Ambulatory Visit: Payer: 59 | Admitting: *Deleted

## 2022-01-26 DIAGNOSIS — J449 Chronic obstructive pulmonary disease, unspecified: Secondary | ICD-10-CM

## 2022-01-26 DIAGNOSIS — G629 Polyneuropathy, unspecified: Secondary | ICD-10-CM

## 2022-01-26 DIAGNOSIS — E1149 Type 2 diabetes mellitus with other diabetic neurological complication: Secondary | ICD-10-CM

## 2022-01-26 DIAGNOSIS — M79671 Pain in right foot: Secondary | ICD-10-CM

## 2022-01-30 NOTE — Chronic Care Management (AMB) (Signed)
Chronic Care Management    Clinical Social Work Note  01/30/2022 Name: Dustin Koch MRN: [id number] DOB: 08/22/1961  Dustin Koch is a 61 y.o. year old male who is a primary care patient of Selena Batten, Chryl Heck, MD. The CCM team was consulted to assist the patient with chronic disease management and/or care coordination needs related to: [employer]  and Financial Difficulties related to limited income .   Engaged with patient by telephone for follow up visit in response to provider referral for social work chronic care management and care coordination services.   Consent to Services:  The patient was given information about Chronic Care Management services, agreed to services, and gave verbal consent prior to initiation of services.  Please see initial visit note for detailed documentation.   Patient agreed to services and consent obtained.   Assessment: Review of patient past medical history, allergies, medications, and health status, including review of relevant consultants reports was performed today as part of a comprehensive evaluation and provision of chronic care management and care coordination services.     SDOH (Social Determinants of Health) assessments and interventions performed:    Advanced Directives Status: Not addressed in this encounter.  CCM Care Plan  No Known Allergies  Outpatient Encounter Medications as of 01/26/2022  Medication Sig   albuterol (VENTOLIN HFA) 108 (90 Base) MCG/ACT inhaler Inhale 2 puffs into the lungs every 6 (six) hours as needed for wheezing or shortness of breath.   atorvastatin (LIPITOR) 10 MG tablet Take 1 tablet (10 mg total) by mouth daily.   azithromycin (ZITHROMAX) 250 MG tablet Take 2 tablets (500 mg) today. Then take 1 tablet daily for the next 4 days.   EPINEPHrine 0.3 mg/0.3 mL IJ SOAJ injection Inject 0.3 mg into the muscle as needed for anaphylaxis.   fluticasone-salmeterol (ADVAIR) 250-50 MCG/ACT AEPB INHALE 1 PUFF INTO THE  LUNGS TWICE A DAY   glucose blood test strip Use to test blood sugar daily.   ipratropium-albuterol (DUONEB) 0.5-2.5 (3) MG/3ML SOLN Take 3 mLs by nebulization every 6 (six) hours as needed.   losartan (COZAAR) 50 MG tablet Take 1 tablet (50 mg total) by mouth daily.   metFORMIN (GLUCOPHAGE) 500 MG tablet Take 2 tablets (1,000 mg total) by mouth 2 (two) times daily with a meal.   OXYGEN Inhale into the lungs. 2 liters as needed   pregabalin (LYRICA) 300 MG capsule Take 1 capsule (300 mg total) by mouth 2 (two) times daily.   rivaroxaban (XARELTO) 20 MG TABS tablet Take 1 tablet (20 mg total) by mouth daily with supper.   rOPINIRole (REQUIP) 0.5 MG tablet Take 1 tablet (0.5 mg total) by mouth at bedtime.   tiotropium (SPIRIVA) 18 MCG inhalation capsule Place 1 capsule (18 mcg total) into inhaler and inhale daily.   No facility-administered encounter medications on file as of 01/26/2022.    Patient Active Problem List   Diagnosis Date Noted   Allergic reaction 01/18/2022   COPD with acute exacerbation (HCC) 11/17/2020   Sleep apnea 10/05/2019   Family history of colon cancer in father 10/05/2019   Chronic anticoagulation 09/12/2018   Dyslipidemia 05/21/2017   DM (diabetes mellitus), type 2 with neurological complications (HCC) 06/25/2016   Chronic pain disorder 06/25/2016   BMI 35.0-35.9,adult 06/25/2016   Peripheral polyneuropathy 06/25/2016   COPD mixed type (HCC) 03/10/2015   Snoring 03/10/2015   Essential hypertension 03/10/2015   GERD (gastroesophageal reflux disease) 03/10/2015   Cough 03/10/2015   Pain  in both feet 03/10/2015   Pedal edema 03/10/2015   Fungal infection 03/10/2015   Tobacco use 03/10/2015   Allergic rhinitis 04/08/2013   Bronchitis 04/08/2013   Fatigue 04/08/2013   Nicotine abuse 04/08/2013   INSOMNIA 02/21/2010   Nodule of middle lobe of right lung 09/09/2009   History of pulmonary embolism 08/29/2009   Acute thromboembolism of deep veins of lower  extremity (HCC) 08/29/2009   ASTHMA 06/28/2008    Conditions to be addressed/monitored: Depression; Limited social support and Lacks knowledge of community resource:    Care Plan : LCSW Plan of Care  Updates made by Buck Mam, LCSW since 01/30/2022 12:00 AM     Problem: Social and Functional Barriers   Priority: High     Long-Range Goal: Assist patient with securing financial, vocational, medical support to promote overall better health of QOL   Start Date: 04/03/2021  Expected End Date: 05/02/2022  This Visit's Progress: On track  Recent Progress: On track  Priority: High  Note:   Current barriers:   Patient in need of assistance with connecting to community resources for Financial constraints related to unemployment, Medication procurement, and Lacks knowledge of community resources Acknowledges deficits with meeting this unmet need Patient is unable to independently navigate community resource options without care coordination support Clinical Goals:  patient will work with SW to address concerns related to community support patient will work with Care Guide to address needs related to [employer], financial stress, etc patient will follow up with Pharmacist on our team* as directed by SW Clinical Interventions:   01/26/22- Pt reports he is still awaiting medical records be sent to his lawyer- CSW assisted pt with calling the medical records department to inquire about this who indicate they do not show any requests.  CSW will further assist pt with steps to get this completed.   CSW spoke with pt who says he is still awaiting word from the law firm he contacted for assistance with appealing SS Disability determination.  His wife is now caring for a granchild- 13yo and pt asked questions about rights/custody,e tc- suggested they speak to someone with DSS for guidance.   Collaboration with Lynnda Child, MD regarding development and update of comprehensive plan of care as  evidenced by provider attestation and co-signature Inter-disciplinary care team collaboration (see longitudinal plan of care) Assessment of needs, barriers , agencies contacted, as well as how impacting  Review various resources, discussed options and provided patient information about Financial constraints related to unable to pay for RX, other expenses due to job loss, Medication procurement, and Lacks knowledge of community resources Collaborated with appropriate clinical care team members regarding patient needs Financial constraints, Medication procurement, and Lacks knowledge of community resources, COPD Patient interviewed and appropriate assessments performed Referred patient to community resources care guide team for assistance with resources to aide in his financial stress due to job loss Provided patient with information about SS Disability, unemployment,etc Collaborated with primary care provider re: RX cost concerns and need for Pharmacist referral/support, samples?, etc Other interventions provided: Solution-Focused Strategies, Active listening / Reflection utilized , Emotional Supportive Provided, and Problem Solving /Task Center  Patient Goals:  -continue to follow up on medical records and with lawyer - follow-up on any referrals for help I am given - make a list of family or friends that I can call   Follow Up Plan: SW will follow up with patient by phone 02/09/22      Follow Up Plan:  Appointment scheduled for SW follow up with client by phone on: 02/09/22      Reece LevyJanet Olan Kurek MSW, LCSW Licensed Clinical Social Worker [facility]   [phone]

## 2022-02-05 ENCOUNTER — Ambulatory Visit: Payer: 59

## 2022-02-05 ENCOUNTER — Ambulatory Visit: Payer: 59 | Admitting: Podiatry

## 2022-02-05 DIAGNOSIS — M722 Plantar fascial fibromatosis: Secondary | ICD-10-CM | POA: Diagnosis not present

## 2022-02-05 DIAGNOSIS — G629 Polyneuropathy, unspecified: Secondary | ICD-10-CM

## 2022-02-05 DIAGNOSIS — I739 Peripheral vascular disease, unspecified: Secondary | ICD-10-CM | POA: Diagnosis not present

## 2022-02-05 DIAGNOSIS — M79671 Pain in right foot: Secondary | ICD-10-CM

## 2022-02-05 NOTE — Patient Instructions (Signed)
You can use Voltaren gel on your feet as well as over-the-counter medicine such as Aspercreme or lidocaine, capsaicin cream.  I placed referral to neurology for you.  Continue shoes with good support and stiffer sole.

## 2022-02-08 NOTE — Progress Notes (Signed)
Subjective:   Patient ID: Dustin Koch, male   DOB: 61 y.o.   MRN: [id number]   HPI 61 year old male presents the office with concerns of bilateral foot pain.  His major discomfort top of his foot he gets burning sensation which makes it difficult for him to sleep and has been ongoing for years.  He states he is fallen 3 times given the burning in his feet.  He is currently on medication for this without significant improvement.  He does describe some pain in the bottom of his feet on the heel and the arch as well.  No recent injury or trauma otherwise.   Review of Systems  All other systems reviewed and are negative.  Past Medical History:  Diagnosis Date   Anticoagulant long-term use    xarelto-- managed by pcp---  hx dvt's/ pe's   COPD (chronic obstructive pulmonary disease) (HCC)    followed by pcp--- (06-25-2019  per pt it's been several months since last exaberation, denies SOB, DOE, or difficulty breathing and stated does not use oxygen as needed for copd)   Emphysema of lung (HCC)    History of DVT of lower extremity    LLE 12/ 2010  and 03/ 2017---  long term anticoagulant   History of MRSA infection    per pt approx.  2014 abscess goin/ thigh area   History of pulmonary embolus (PE)    12/ 2010  bilateral PE   Hypertension    followed by pcp   Left hydrocele    Nocturia    Nocturnal oxygen desaturation    per pt uses O2 @ 2L via [place] only   Peripheral polyneuropathy    Sleep apnea    06-25-2019 per pt has never had a sleep study , however stated while in hospital yrs ago his oxygen sat's was checked during the night and was low;  that when he was started on oxygen at night via [place]    Smokers' cough (HCC)    per pt occasionlly productive   Type 2 diabetes mellitus (HCC)    followed by pcp---  pt states fasting cbg 120-135,  check's cbg twice weekly   Wears glasses     Past Surgical History:  Procedure Laterality Date   HYDROCELE EXCISION Left 06/26/2019   Procedure:  HYDROCELECTOMY ADULT;  Surgeon: Ihor Gully, MD;  [facility];  Service: Urology;  Laterality: Left;   INCISION AND DRAINAGE ABSCESS  x2  2014  approx.     Current Outpatient Medications:    albuterol (VENTOLIN HFA) 108 (90 Base) MCG/ACT inhaler, Inhale 2 puffs into the lungs every 6 (six) hours as needed for wheezing or shortness of breath., Disp: 1 each, Rfl: 1   atorvastatin (LIPITOR) 10 MG tablet, Take 1 tablet (10 mg total) by mouth daily., Disp: 90 tablet, Rfl: 3   azithromycin (ZITHROMAX) 250 MG tablet, Take 2 tablets (500 mg) today. Then take 1 tablet daily for the next 4 days., Disp: 6 tablet, Rfl: 0   EPINEPHrine 0.3 mg/0.3 mL IJ SOAJ injection, Inject 0.3 mg into the muscle as needed for anaphylaxis., Disp: 1 each, Rfl: 1   fluticasone-salmeterol (ADVAIR) 250-50 MCG/ACT AEPB, INHALE 1 PUFF INTO THE LUNGS TWICE A DAY, Disp: 180 each, Rfl: 1   glucose blood test strip, Use to test blood sugar daily., Disp: 100 each, Rfl: 2   ipratropium-albuterol (DUONEB) 0.5-2.5 (3) MG/3ML SOLN, Take 3 mLs by nebulization every 6 (six) hours as needed.,  Disp: 360 mL, Rfl: 0   losartan (COZAAR) 50 MG tablet, Take 1 tablet (50 mg total) by mouth daily., Disp: 90 tablet, Rfl: 3   metFORMIN (GLUCOPHAGE) 500 MG tablet, Take 2 tablets (1,000 mg total) by mouth 2 (two) times daily with a meal., Disp: 360 tablet, Rfl: 3   OXYGEN, Inhale into the lungs. 2 liters as needed, Disp: , Rfl:    pregabalin (LYRICA) 300 MG capsule, Take 1 capsule (300 mg total) by mouth 2 (two) times daily., Disp: 180 capsule, Rfl: 1   rivaroxaban (XARELTO) 20 MG TABS tablet, Take 1 tablet (20 mg total) by mouth daily with supper., Disp: 90 tablet, Rfl: 3   rOPINIRole (REQUIP) 0.5 MG tablet, Take 1 tablet (0.5 mg total) by mouth at bedtime., Disp: 90 tablet, Rfl: 3   tiotropium (SPIRIVA) 18 MCG inhalation capsule, Place 1 capsule (18 mcg total) into inhaler and inhale daily., Disp: 90 capsule, Rfl: 3  No  Known Allergies        Objective:  Physical Exam  General: AAO x3, NAD  Dermatological: Skin is warm, dry and supple bilateral.  There are no open sores, no preulcerative lesions, no rash or signs of infection present.  Vascular: Dorsalis Pedis artery and Posterior Tibial artery pedal pulses are palpable 1/4 bilateral with immedate capillary fill time.  There is no pain with calf compression, swelling, warmth, erythema.   Neruologic: Sensation decreased with Semmes Weinstein monofilament  Musculoskeletal: Mild discomfort on the insertion of plantar fascia and plantar aspect the heel as well as the arch of the foot.  No area pinpoint tenderness.  Flexor, extensor tendons appear to be intact.  Muscular strength 5/5 in all groups tested bilateral.  Gait: Unassisted, Nonantalgic.       Assessment:   61 year old male with bilateral foot pain, likely neuropathy; plan fasciitis     Plan:  -Treatment options discussed including all alternatives, risks, and complications -Etiology of symptoms were discussed -X-rays were obtained and reviewed with the patient.  3 views of bilateral feet were obtained.  No evidence of acute fracture. -Given ongoing nerve symptoms and refer to neurology.  Experiencing medication without significant improvement.  Most of this in the left leg after try to help.  We will order an ABI to make sure is not a vascular component to this. -Discussed tension, icing for the plantar fasciitis as well as wearing shoes and arch supports.  Vivi Barrack DPM

## 2022-02-09 ENCOUNTER — Ambulatory Visit: Payer: 59 | Admitting: *Deleted

## 2022-02-09 DIAGNOSIS — J449 Chronic obstructive pulmonary disease, unspecified: Secondary | ICD-10-CM

## 2022-02-12 NOTE — Patient Instructions (Signed)
Visit Information  Thank you for taking time to visit with me today. Please don't hesitate to contact me if I can be of assistance to you before our next scheduled telephone appointment.   Our next appointment is by telephone on 03/02/22  Please call the care guide team at [phone] if you need to cancel or reschedule your appointment.   If you are experiencing a Mental Health or Behavioral Health Crisis or need someone to talk to, please call 911   The patient verbalized understanding of instructions, educational materials, and care plan provided today and DECLINED offer to receive copy of patient instructions, educational materials, and care plan.   Landy Mace MSW, LCSW Licensed Clinical Social Worker [facility]   [phone]  

## 2022-02-12 NOTE — Chronic Care Management (AMB) (Signed)
Chronic Care Management    Clinical Social Work Note  02/12/2022 Name: Dustin Koch MRN: [id number] DOB: August 11, 1961  Dustin Koch is a 61 y.o. year old male who is a primary care patient of Selena Batten, Chryl Heck, MD. The CCM team was consulted to assist the patient with chronic disease management and/or care coordination needs related to: [employer] , Mental Health Counseling and Resources, and Financial Difficulties related to pending disability .   Engaged with patient by telephone for follow up visit in response to provider referral for social work chronic care management and care coordination services.   Consent to Services:  The patient was given information about Chronic Care Management services, agreed to services, and gave verbal consent prior to initiation of services.  Please see initial visit note for detailed documentation.   Patient agreed to services and consent obtained.   Assessment: Review of patient past medical history, allergies, medications, and health status, including review of relevant consultants reports was performed today as part of a comprehensive evaluation and provision of chronic care management and care coordination services.     SDOH (Social Determinants of Health) assessments and interventions performed:    Advanced Directives Status: Not addressed in this encounter.  CCM Care Plan  No Known Allergies  Outpatient Encounter Medications as of 02/09/2022  Medication Sig   albuterol (VENTOLIN HFA) 108 (90 Base) MCG/ACT inhaler Inhale 2 puffs into the lungs every 6 (six) hours as needed for wheezing or shortness of breath.   atorvastatin (LIPITOR) 10 MG tablet Take 1 tablet (10 mg total) by mouth daily.   azithromycin (ZITHROMAX) 250 MG tablet Take 2 tablets (500 mg) today. Then take 1 tablet daily for the next 4 days.   EPINEPHrine 0.3 mg/0.3 mL IJ SOAJ injection Inject 0.3 mg into the muscle as needed for anaphylaxis.   fluticasone-salmeterol (ADVAIR)  250-50 MCG/ACT AEPB INHALE 1 PUFF INTO THE LUNGS TWICE A DAY   glucose blood test strip Use to test blood sugar daily.   ipratropium-albuterol (DUONEB) 0.5-2.5 (3) MG/3ML SOLN Take 3 mLs by nebulization every 6 (six) hours as needed.   losartan (COZAAR) 50 MG tablet Take 1 tablet (50 mg total) by mouth daily.   metFORMIN (GLUCOPHAGE) 500 MG tablet Take 2 tablets (1,000 mg total) by mouth 2 (two) times daily with a meal.   OXYGEN Inhale into the lungs. 2 liters as needed   pregabalin (LYRICA) 300 MG capsule Take 1 capsule (300 mg total) by mouth 2 (two) times daily.   rivaroxaban (XARELTO) 20 MG TABS tablet Take 1 tablet (20 mg total) by mouth daily with supper.   rOPINIRole (REQUIP) 0.5 MG tablet Take 1 tablet (0.5 mg total) by mouth at bedtime.   tiotropium (SPIRIVA) 18 MCG inhalation capsule Place 1 capsule (18 mcg total) into inhaler and inhale daily.   No facility-administered encounter medications on file as of 02/09/2022.    Patient Active Problem List   Diagnosis Date Noted   Allergic reaction 01/18/2022   COPD with acute exacerbation (HCC) 11/17/2020   Sleep apnea 10/05/2019   Family history of colon cancer in father 10/05/2019   Chronic anticoagulation 09/12/2018   Dyslipidemia 05/21/2017   DM (diabetes mellitus), type 2 with neurological complications (HCC) 06/25/2016   Chronic pain disorder 06/25/2016   BMI 35.0-35.9,adult 06/25/2016   Peripheral polyneuropathy 06/25/2016   COPD mixed type (HCC) 03/10/2015   Snoring 03/10/2015   Essential hypertension 03/10/2015   GERD (gastroesophageal reflux disease) 03/10/2015  Cough 03/10/2015   Pain in both feet 03/10/2015   Pedal edema 03/10/2015   Fungal infection 03/10/2015   Tobacco use 03/10/2015   Allergic rhinitis 04/08/2013   Bronchitis 04/08/2013   Fatigue 04/08/2013   Nicotine abuse 04/08/2013   INSOMNIA 02/21/2010   Nodule of middle lobe of right lung 09/09/2009   History of pulmonary embolism 08/29/2009   Acute  thromboembolism of deep veins of lower extremity (HCC) 08/29/2009   ASTHMA 06/28/2008    Conditions to be addressed/monitored: COPD; Financial constraints related to 03/02/22, Limited social support, and Mental Health Concerns   There are no care plans that you recently modified to display for this patient.    Follow Up Plan: Appointment scheduled for SW follow up with client by phone on:       Reece Levy MSW, LCSW Licensed Clinical Social Worker [facility] [place]   [phone]

## 2022-02-15 ENCOUNTER — Inpatient Hospital Stay (HOSPITAL_COMMUNITY): Admission: RE | Admit: 2022-02-15 | Payer: 59 | Source: Ambulatory Visit | Attending: Podiatry | Admitting: Podiatry

## 2022-02-15 ENCOUNTER — Ambulatory Visit: Payer: 59 | Admitting: Family Medicine

## 2022-02-20 ENCOUNTER — Ambulatory Visit (INDEPENDENT_AMBULATORY_CARE_PROVIDER_SITE_OTHER): Payer: 59 | Admitting: Family Medicine

## 2022-02-20 ENCOUNTER — Encounter: Payer: Self-pay | Admitting: Family Medicine

## 2022-02-20 VITALS — BP 138/80 | HR 83 | Temp 97.5°F | Ht 71.75 in | Wt 248.1 lb

## 2022-02-20 DIAGNOSIS — E785 Hyperlipidemia, unspecified: Secondary | ICD-10-CM

## 2022-02-20 DIAGNOSIS — G2581 Restless legs syndrome: Secondary | ICD-10-CM

## 2022-02-20 DIAGNOSIS — I10 Essential (primary) hypertension: Secondary | ICD-10-CM | POA: Diagnosis not present

## 2022-02-20 DIAGNOSIS — E1149 Type 2 diabetes mellitus with other diabetic neurological complication: Secondary | ICD-10-CM

## 2022-02-20 DIAGNOSIS — J449 Chronic obstructive pulmonary disease, unspecified: Secondary | ICD-10-CM

## 2022-02-20 DIAGNOSIS — G629 Polyneuropathy, unspecified: Secondary | ICD-10-CM

## 2022-02-20 LAB — POCT GLYCOSYLATED HEMOGLOBIN (HGB A1C): Hemoglobin A1C: 6.8 % — AB (ref 4.0–5.6)

## 2022-02-20 MED ORDER — LOSARTAN POTASSIUM 50 MG PO TABS: 50.0000 mg | ORAL_TABLET | Freq: Every day | ORAL | 3 refills | Status: DC

## 2022-02-20 MED ORDER — METFORMIN HCL 500 MG PO TABS: 1000.0000 mg | ORAL_TABLET | Freq: Two times a day (BID) | ORAL | 0 refills | Status: DC

## 2022-02-20 MED ORDER — FLUTICASONE-SALMETEROL 250-50 MCG/ACT IN AEPB: INHALATION_SPRAY | RESPIRATORY_TRACT | 1 refills | Status: DC

## 2022-02-20 MED ORDER — PREGABALIN 300 MG PO CAPS: 300.0000 mg | ORAL_CAPSULE | Freq: Two times a day (BID) | ORAL | 0 refills | Status: DC

## 2022-02-20 MED ORDER — ALBUTEROL SULFATE HFA 108 (90 BASE) MCG/ACT IN AERS: 2.0000 | INHALATION_SPRAY | Freq: Four times a day (QID) | RESPIRATORY_TRACT | 1 refills | Status: DC | PRN

## 2022-02-20 MED ORDER — IPRATROPIUM-ALBUTEROL 0.5-2.5 (3) MG/3ML IN SOLN: 3.0000 mL | Freq: Four times a day (QID) | RESPIRATORY_TRACT | 0 refills | Status: AC | PRN

## 2022-02-20 MED ORDER — ATORVASTATIN CALCIUM 10 MG PO TABS: 10.0000 mg | ORAL_TABLET | Freq: Every day | ORAL | 3 refills | Status: DC

## 2022-02-20 MED ORDER — ROPINIROLE HCL 0.5 MG PO TABS: 0.5000 mg | ORAL_TABLET | Freq: Every day | ORAL | 1 refills | Status: DC

## 2022-02-20 MED ORDER — GLIPIZIDE 5 MG PO TABS: 5.0000 mg | ORAL_TABLET | Freq: Two times a day (BID) | ORAL | 3 refills | Status: DC

## 2022-02-20 NOTE — Assessment & Plan Note (Signed)
Spiriva is too expensive per patient.  Co-pay for specialist is also very expensive, though encouraged pulmonology follow-up.  Refill of DuoNeb, albuterol, and Advair.  He has been cutting back on cigarettes.

## 2022-02-20 NOTE — Assessment & Plan Note (Signed)
He is not fasting today, will check fasting labs in 3 months.  Continue atorvastatin 10 mg daily.

## 2022-02-20 NOTE — Progress Notes (Unsigned)
Subjective:     Dustin Koch is a 61 y.o. male presenting for Follow-up (DM )     HPI  #Diabetes Currently taking metformin (Glucophage, Riomet)  Using medications without difficulties: Yes Hypoglycemic episodes: does not check Hyperglycemic episodes: does not check Feet problems:No  Blood Sugars averaging: does not check Last HgbA1c:  Lab Results  Component Value Date   HGBA1C 8.3 (H) 03/21/2021   Saw the disability doctor on Saturday   Diabetes Health Maintenance Due:    Diabetes Health Maintenance Due  Topic Date Due   FOOT EXAM  01/05/2021   OPHTHALMOLOGY EXAM  02/04/2021   HEMOGLOBIN A1C  09/21/2021   Trying to follow diet Has been watching portions Weight down 20 lbs since last year  #COPD - cannot afford spiriva - using nebulizer 1-2 times a day - not every day - at baseline today   Review of Systems  01/18/2022: Clinic - COPD - not using 02, neuropathy - cont lyrica, podiatry referral  Social History   Tobacco Use  Smoking Status Every Day   Packs/day: 1.00   Years: 40.00   Total pack years: 40.00   Types: Cigarettes  Smokeless Tobacco Never  Tobacco Comments   1/2-1 pack per day         Objective:    BP Readings from Last 3 Encounters:  02/20/22 138/80  01/18/22 132/70  03/21/21 (!) 152/86   Wt Readings from Last 3 Encounters:  02/20/22 248 lb 2 oz (112.5 kg)  01/18/22 247 lb 6 oz (112.2 kg)  03/21/21 269 lb (122 kg)    BP 138/80   Pulse 83   Temp (!) 97.5 F (36.4 C) (Temporal)   Ht 5' 11.75" (1.822 m)   Wt 248 lb 2 oz (112.5 kg)   SpO2 94%   BMI 33.89 kg/m    Physical Exam Constitutional:      Appearance: Normal appearance. He is not ill-appearing or diaphoretic.  HENT:     Right Ear: External ear normal.     Left Ear: External ear normal.     Nose: Nose normal.  Eyes:     General: No scleral icterus.    Extraocular Movements: Extraocular movements intact.     Conjunctiva/sclera: Conjunctivae normal.   Cardiovascular:     Rate and Rhythm: Normal rate and regular rhythm.     Heart sounds: No murmur heard. Pulmonary:     Effort: Pulmonary effort is normal. Prolonged expiration present.     Breath sounds: Decreased air movement present. Wheezing present.  Musculoskeletal:     Cervical back: Neck supple.  Skin:    General: Skin is warm and dry.  Neurological:     Mental Status: He is alert. Mental status is at baseline.  Psychiatric:        Mood and Affect: Mood normal.           Assessment & Plan:   Problem List Items Addressed This Visit       Cardiovascular and Mediastinum   Essential hypertension    Blood pressure controlled, continue losartan 50 mg daily.      Relevant Medications   atorvastatin (LIPITOR) 10 MG tablet   losartan (COZAAR) 50 MG tablet     Respiratory   COPD mixed type (HCC)    Spiriva is too expensive per patient.  Co-pay for specialist is also very expensive, though encouraged pulmonology follow-up.  Refill of DuoNeb, albuterol, and Advair.  He has been cutting back on  cigarettes.      Relevant Medications   albuterol (VENTOLIN HFA) 108 (90 Base) MCG/ACT inhaler   fluticasone-salmeterol (ADVAIR) 250-50 MCG/ACT AEPB   ipratropium-albuterol (DUONEB) 0.5-2.5 (3) MG/3ML SOLN     Endocrine   DM (diabetes mellitus), type 2 with neurological complications (HCC) - Primary    Lab Results  Component Value Date   HGBA1C 8.3 (H) 03/21/2021  Poorly controlled, he is working on diet.  Continue metformin 1000 mg twice daily.  Start glipizide 5 mg daily increase to twice daily after 1 week.  Return in 3 months.      Relevant Medications   atorvastatin (LIPITOR) 10 MG tablet   losartan (COZAAR) 50 MG tablet   metFORMIN (GLUCOPHAGE) 500 MG tablet   glipiZIDE (GLUCOTROL) 5 MG tablet   Other Relevant Orders   POCT glycosylated hemoglobin (Hb A1C)     Nervous and Auditory   Peripheral polyneuropathy    Symptoms are persistent, slight improvement with  Lyrica.  Continue Lyrica 300 mg twice daily.  Refill provided      Relevant Medications   pregabalin (LYRICA) 300 MG capsule   rOPINIRole (REQUIP) 0.5 MG tablet     Other   Dyslipidemia    He is not fasting today, will check fasting labs in 3 months.  Continue atorvastatin 10 mg daily.      Relevant Medications   atorvastatin (LIPITOR) 10 MG tablet   Restless legs    Controlled on Requip 0.5 mg.      Relevant Medications   rOPINIRole (REQUIP) 0.5 MG tablet   Other Visit Diagnoses     COPD, severe (HCC)       Relevant Medications   albuterol (VENTOLIN HFA) 108 (90 Base) MCG/ACT inhaler   fluticasone-salmeterol (ADVAIR) 250-50 MCG/ACT AEPB   ipratropium-albuterol (DUONEB) 0.5-2.5 (3) MG/3ML SOLN   COPD, mild (HCC)       Relevant Medications   albuterol (VENTOLIN HFA) 108 (90 Base) MCG/ACT inhaler   fluticasone-salmeterol (ADVAIR) 250-50 MCG/ACT AEPB   ipratropium-albuterol (DUONEB) 0.5-2.5 (3) MG/3ML SOLN        Return in about 3 months (around 05/23/2022) for Diabtes - early morning appointment.  Lynnda Child, MD

## 2022-02-20 NOTE — Assessment & Plan Note (Signed)
Lab Results  Component Value Date   HGBA1C 8.3 (H) 03/21/2021   Poorly controlled, he is working on diet.  Continue metformin 1000 mg twice daily.  Start glipizide 5 mg daily increase to twice daily after 1 week.  Return in 3 months.

## 2022-02-20 NOTE — Assessment & Plan Note (Signed)
Symptoms are persistent, slight improvement with Lyrica.  Continue Lyrica 300 mg twice daily.  Refill provided

## 2022-02-20 NOTE — Assessment & Plan Note (Signed)
Controlled on Requip 0.5 mg.

## 2022-02-20 NOTE — Patient Instructions (Addendum)
Glipizide 5 mg  Week 1: 5 mg with breakfast Week 2: 5 mg twice daily with food  Continue metformin    Call and schedule follow-up with Dr. Maple Hudson with pulmonology  I will have my pharmacist see if there are less expensive option for you

## 2022-02-20 NOTE — Assessment & Plan Note (Signed)
Blood pressure controlled, continue losartan 50 mg daily.

## 2022-03-02 ENCOUNTER — Ambulatory Visit: Payer: 59 | Admitting: *Deleted

## 2022-03-02 DIAGNOSIS — I10 Essential (primary) hypertension: Secondary | ICD-10-CM

## 2022-03-02 DIAGNOSIS — E1149 Type 2 diabetes mellitus with other diabetic neurological complication: Secondary | ICD-10-CM

## 2022-03-02 DIAGNOSIS — J449 Chronic obstructive pulmonary disease, unspecified: Secondary | ICD-10-CM

## 2022-03-02 NOTE — Chronic Care Management (AMB) (Signed)
Chronic Care Management    Clinical Social Work Note  03/02/2022 Name: Dustin Koch MRN: [id number] DOB: [birth year]  Dustin Koch is a 61 y.o. year old male who is a primary care patient of Dustin Koch, Dustin Heck, MD. The CCM team was consulted to assist the patient with chronic disease management and/or care coordination needs related to: [employer]  and Financial Difficulties related to disability pending .   Engaged with patient by telephone for follow up visit in response to provider referral for social work chronic care management and care coordination services.   Consent to Services:  The patient was given information about Chronic Care Management services, agreed to services, and gave verbal consent prior to initiation of services.  Please see initial visit note for detailed documentation.   Patient agreed to services and consent obtained.   Assessment: Review of patient past medical history, allergies, medications, and health status, including review of relevant consultants reports was performed today as part of a comprehensive evaluation and provision of chronic care management and care coordination services.     SDOH (Social Determinants of Health) assessments and interventions performed:    Advanced Directives Status: Not addressed in this encounter.  CCM Care Plan  No Known Allergies  Outpatient Encounter Medications as of 03/02/2022  Medication Sig   albuterol (VENTOLIN HFA) 108 (90 Base) MCG/ACT inhaler Inhale 2 puffs into the lungs every 6 (six) hours as needed for wheezing or shortness of breath.   atorvastatin (LIPITOR) 10 MG tablet Take 1 tablet (10 mg total) by mouth daily.   EPINEPHrine 0.3 mg/0.3 mL IJ SOAJ injection Inject 0.3 mg into the muscle as needed for anaphylaxis.   fluticasone-salmeterol (ADVAIR) 250-50 MCG/ACT AEPB INHALE 1 PUFF INTO THE LUNGS TWICE A DAY   glipiZIDE (GLUCOTROL) 5 MG tablet Take 1 tablet (5 mg total) by mouth 2 (two) times daily before a  meal.   glucose blood test strip Use to test blood sugar daily.   ipratropium-albuterol (DUONEB) 0.5-2.5 (3) MG/3ML SOLN Take 3 mLs by nebulization every 6 (six) hours as needed.   losartan (COZAAR) 50 MG tablet Take 1 tablet (50 mg total) by mouth daily.   metFORMIN (GLUCOPHAGE) 500 MG tablet Take 2 tablets (1,000 mg total) by mouth 2 (two) times daily with a meal.   OXYGEN Inhale into the lungs. 2 liters as needed   pregabalin (LYRICA) 300 MG capsule Take 1 capsule (300 mg total) by mouth 2 (two) times daily.   rivaroxaban (XARELTO) 20 MG TABS tablet Take 1 tablet (20 mg total) by mouth daily with supper.   rOPINIRole (REQUIP) 0.5 MG tablet Take 1 tablet (0.5 mg total) by mouth at bedtime.   No facility-administered encounter medications on file as of 03/02/2022.    Patient Active Problem List   Diagnosis Date Noted   Restless legs 02/20/2022   Allergic reaction 01/18/2022   COPD with acute exacerbation (HCC) 11/17/2020   Sleep apnea 10/05/2019   Family history of colon cancer in father 10/05/2019   Chronic anticoagulation 09/12/2018   Dyslipidemia 05/21/2017   DM (diabetes mellitus), type 2 with neurological complications (HCC) 06/25/2016   Chronic pain disorder 06/25/2016   BMI 35.0-35.9,adult 06/25/2016   Peripheral polyneuropathy 06/25/2016   COPD mixed type (HCC) 03/10/2015   Snoring 03/10/2015   Essential hypertension 03/10/2015   GERD (gastroesophageal reflux disease) 03/10/2015   Cough 03/10/2015   Pain in both feet 03/10/2015   Pedal edema 03/10/2015   Fungal infection 03/10/2015  Tobacco use 03/10/2015   Allergic rhinitis 04/08/2013   Bronchitis 04/08/2013   Fatigue 04/08/2013   Nicotine abuse 04/08/2013   INSOMNIA 02/21/2010   Nodule of middle lobe of right lung 09/09/2009   History of pulmonary embolism 08/29/2009   Acute thromboembolism of deep veins of lower extremity (HCC) 08/29/2009   ASTHMA 06/28/2008    Conditions to be addressed/monitored:    ;  Financial constraints related to disability pending  Care Plan : LCSW Plan of Care  Updates made by Buck Mam, LCSW since 03/02/2022 12:00 AM     Problem: Social and Functional Barriers   Priority: High     Long-Range Goal: Assist patient with securing financial, vocational, medical support to promote overall better health of QOL Completed 03/02/2022  Start Date: 04/03/2021  Expected End Date: 05/02/2022  This Visit's Progress: On track  Recent Progress: On track  Priority: High  Note:   Current barriers:   Patient in need of assistance with connecting to community resources for Financial constraints related to unemployment, Medication procurement, and Lacks knowledge of community resources Acknowledges deficits with meeting this unmet need Patient is unable to independently navigate community resource options without care coordination support Clinical Goals:  patient will work with SW to address concerns related to community support patient will work with Care Guide to address needs related to [employer], financial stress, etc patient will follow up with Pharmacist on our team* as directed by SW Clinical Interventions:   03/02/22- Pt reports he had "a thorough exam by 2 Doctors" for the Social Security Disability determination. He is now awaiting further word- He has an "app on my phone" that keeps him updated via the Law office helping him.  CSW advised pt of plans to sign off and he will outreach if further needs arise. Pt appreciative of support and assistance provided.   02/09/22- Pt reports he was successful in getting medical records sent directly [facility].  Today is his birthday- offered wishes to him; states he is with his wife for the weekend.   CSW spoke with pt who says he is still awaiting word from the law firm he contacted for assistance with appealing SS Disability determination.  His wife is now caring for a granchild- 13yo and pt asked questions about rights/custody,e  tc- suggested they speak to someone with DSS for guidance.   Collaboration with Lynnda Child, MD regarding development and update of comprehensive plan of care as evidenced by provider attestation and co-signature Inter-disciplinary care team collaboration (see longitudinal plan of care) Assessment of needs, barriers , agencies contacted, as well as how impacting  Review various resources, discussed options and provided patient information about Financial constraints related to unable to pay for RX, other expenses due to job loss, Medication procurement, and Lacks knowledge of community resources Collaborated with appropriate clinical care team members regarding patient needs Financial constraints, Medication procurement, and Lacks knowledge of community resources, COPD Patient interviewed and appropriate assessments performed Referred patient to community resources care guide team for assistance with resources to aide in his financial stress due to job loss Provided patient with information about SS Disability, unemployment,etc Collaborated with primary care provider re: RX cost concerns and need for Pharmacist referral/support, samples?, etc Other interventions provided: Solution-Focused Strategies, Active listening / Reflection utilized , Emotional Supportive Provided, and Problem Solving /Task Center  Patient Goals:  -continue to follow up on medical records and with lawyer - follow-up on any referrals for help I am given -  make a list of family or friends that I can call   Follow Up Plan: N/A      Follow Up Plan: Client will reach out if further needs arise      Eduard Clos MSW, LCSW Licensed Clinical Social Worker [place]   [phone]

## 2022-05-18 ENCOUNTER — Other Ambulatory Visit: Payer: Self-pay | Admitting: Family Medicine

## 2022-05-18 DIAGNOSIS — E1149 Type 2 diabetes mellitus with other diabetic neurological complication: Secondary | ICD-10-CM

## 2022-05-23 ENCOUNTER — Ambulatory Visit (INDEPENDENT_AMBULATORY_CARE_PROVIDER_SITE_OTHER): Payer: 59 | Admitting: Family Medicine

## 2022-05-23 ENCOUNTER — Telehealth: Payer: Self-pay | Admitting: Pharmacist

## 2022-05-23 VITALS — BP 120/80 | HR 74 | Temp 97.6°F | Wt 249.2 lb

## 2022-05-23 DIAGNOSIS — I10 Essential (primary) hypertension: Secondary | ICD-10-CM | POA: Diagnosis not present

## 2022-05-23 DIAGNOSIS — J441 Chronic obstructive pulmonary disease with (acute) exacerbation: Secondary | ICD-10-CM

## 2022-05-23 DIAGNOSIS — Z5986 Financial insecurity: Secondary | ICD-10-CM

## 2022-05-23 DIAGNOSIS — G629 Polyneuropathy, unspecified: Secondary | ICD-10-CM

## 2022-05-23 DIAGNOSIS — G4709 Other insomnia: Secondary | ICD-10-CM

## 2022-05-23 DIAGNOSIS — E785 Hyperlipidemia, unspecified: Secondary | ICD-10-CM

## 2022-05-23 DIAGNOSIS — E1149 Type 2 diabetes mellitus with other diabetic neurological complication: Secondary | ICD-10-CM

## 2022-05-23 DIAGNOSIS — J449 Chronic obstructive pulmonary disease, unspecified: Secondary | ICD-10-CM | POA: Diagnosis not present

## 2022-05-23 DIAGNOSIS — G2581 Restless legs syndrome: Secondary | ICD-10-CM

## 2022-05-23 DIAGNOSIS — Z7901 Long term (current) use of anticoagulants: Secondary | ICD-10-CM

## 2022-05-23 LAB — LIPID PANEL
Cholesterol: 138 mg/dL (ref 0–200)
HDL: 58.4 mg/dL (ref >39.00)
LDL Cholesterol: 72 mg/dL (ref 0–99)
NonHDL: 80.05
Total CHOL/HDL Ratio: 2
Triglycerides: 42 mg/dL (ref 0.0–149.0)
VLDL: 8.4 mg/dL (ref 0.0–40.0)

## 2022-05-23 LAB — COMPREHENSIVE METABOLIC PANEL
ALT: 15 U/L (ref 0–53)
AST: 13 U/L (ref 0–37)
Albumin: 3.8 g/dL (ref 3.5–5.2)
Alkaline Phosphatase: 75 U/L (ref 39–117)
BUN: 18 mg/dL (ref 6–23)
CO2: 30 mEq/L (ref 19–32)
Calcium: 8.9 mg/dL (ref 8.4–10.5)
Chloride: 105 mEq/L (ref 96–112)
Creatinine, Ser: 0.94 mg/dL (ref 0.40–1.50)
GFR: 87.63 mL/min (ref >60.00)
Glucose, Bld: 119 mg/dL — ABNORMAL HIGH (ref 70–99)
Potassium: 4.7 mEq/L (ref 3.5–5.1)
Sodium: 140 mEq/L (ref 135–145)
Total Bilirubin: 0.4 mg/dL (ref 0.2–1.2)
Total Protein: 6.7 g/dL (ref 6.0–8.3)

## 2022-05-23 LAB — CBC
HCT: 45 % (ref 39.0–52.0)
Hemoglobin: 15.3 g/dL (ref 13.0–17.0)
MCHC: 33.9 g/dL (ref 30.0–36.0)
MCV: 96.5 fl (ref 78.0–100.0)
Platelets: 198 10*3/uL (ref 150.0–400.0)
RBC: 4.66 Mil/uL (ref 4.22–5.81)
RDW: 14.2 % (ref 11.5–15.5)
WBC: 6.4 10*3/uL (ref 4.0–10.5)

## 2022-05-23 LAB — POCT GLYCOSYLATED HEMOGLOBIN (HGB A1C): Hemoglobin A1C: 7 % — AB (ref 4.0–5.6)

## 2022-05-23 MED ORDER — LOSARTAN POTASSIUM 50 MG PO TABS: 50.0000 mg | ORAL_TABLET | Freq: Every day | ORAL | 1 refills | Status: AC

## 2022-05-23 MED ORDER — METFORMIN HCL 500 MG PO TABS: ORAL_TABLET | ORAL | 0 refills | Status: AC

## 2022-05-23 MED ORDER — GLIPIZIDE 5 MG PO TABS: 5.0000 mg | ORAL_TABLET | Freq: Two times a day (BID) | ORAL | 1 refills | Status: AC

## 2022-05-23 MED ORDER — FLUTICASONE-SALMETEROL 250-50 MCG/ACT IN AEPB: INHALATION_SPRAY | RESPIRATORY_TRACT | 0 refills | Status: AC

## 2022-05-23 MED ORDER — SPIRIVA RESPIMAT 2.5 MCG/ACT IN AERS: 2.0000 | INHALATION_SPRAY | Freq: Every day | RESPIRATORY_TRACT | 11 refills | Status: AC

## 2022-05-23 MED ORDER — ALBUTEROL SULFATE HFA 108 (90 BASE) MCG/ACT IN AERS: 2.0000 | INHALATION_SPRAY | Freq: Four times a day (QID) | RESPIRATORY_TRACT | 1 refills | Status: AC | PRN

## 2022-05-23 MED ORDER — TRAZODONE HCL 50 MG PO TABS: 50.0000 mg | ORAL_TABLET | Freq: Every evening | ORAL | 0 refills | Status: DC | PRN

## 2022-05-23 MED ORDER — PREGABALIN 300 MG PO CAPS: 300.0000 mg | ORAL_CAPSULE | Freq: Two times a day (BID) | ORAL | 0 refills | Status: AC

## 2022-05-23 MED ORDER — GLUCOSE BLOOD VI STRP: ORAL_STRIP | 2 refills | Status: AC

## 2022-05-23 MED ORDER — ROPINIROLE HCL 0.5 MG PO TABS: 0.5000 mg | ORAL_TABLET | Freq: Every day | ORAL | 0 refills | Status: AC

## 2022-05-23 MED ORDER — ATORVASTATIN CALCIUM 10 MG PO TABS: 10.0000 mg | ORAL_TABLET | Freq: Every day | ORAL | 1 refills | Status: AC

## 2022-05-23 MED ORDER — RIVAROXABAN 20 MG PO TABS: 20.0000 mg | ORAL_TABLET | Freq: Every day | ORAL | 1 refills | Status: AC

## 2022-05-23 MED ORDER — PREDNISONE 20 MG PO TABS: 40.0000 mg | ORAL_TABLET | Freq: Every day | ORAL | 0 refills | Status: AC

## 2022-05-23 NOTE — Patient Instructions (Addendum)
Dustin Mutters, MD [facility] [place], [place] [address]    Find a new provider near [facility] Trazodone 50 mg  If no effect - increase to 75 mg and then 100 mg  Update me in 2 weeks with dose that is working for refill   Steroids to help with cough/breathing  Social should call to try and get medicaid covered   I will check with pharmacist on Spiriva

## 2022-05-23 NOTE — Assessment & Plan Note (Signed)
Lab Results  Component Value Date   HGBA1C 7.0 (A) 05/23/2022   Adequate control. Cont metformin 1000 mg bid and glipizide 5 mg bid.

## 2022-05-23 NOTE — Assessment & Plan Note (Signed)
Exacerbation. Tight and poor air movement. Prednisone x 5 days. Will also work with pharmacy to look for lower cost inhalers. Cont advair and albuterol.

## 2022-05-23 NOTE — Assessment & Plan Note (Signed)
Stable, continue requip 0.5mg 

## 2022-05-23 NOTE — Progress Notes (Signed)
Subjective:     Dustin Koch is a 61 y.o. male presenting for Follow-up (3 mo- DM/Declined flu shot. No recent eye exam) and Medication Refill (Change in pharmacy)     HPI  #Diabetes Currently taking metformin, glipizide  Using medications without difficulties: Yes Hypoglycemic episodes: not checking Hyperglycemic episodes: not checking Feet problems:Yes  Blood Sugars averaging: does not check Last HgbA1c:  Lab Results  Component Value Date   HGBA1C 7.0 (A) 05/23/2022    Diabetes Health Maintenance Due:    Diabetes Health Maintenance Due  Topic Date Due   FOOT EXAM  01/05/2021   OPHTHALMOLOGY EXAM  02/04/2021   HEMOGLOBIN A1C  11/21/2022    Lyrica helping with the tingling Still with burning pain  Burning keeps him up at night Gabapentin not as effective Worse when he misses lyrica  Waking up every 2-3 hours overnight - trouble falling back asleep  - nocturia - but does not have trouble starting a stream or emptying the bladder - takes 30 minutes to fall back asleep  Cough x 3 weeks  Improving now On O2 Still smoking 1/2 ppd   Review of Systems   Social History   Tobacco Use  Smoking Status Every Day   Packs/day: 1.00   Years: 40.00   Total pack years: 40.00   Types: Cigarettes  Smokeless Tobacco Never  Tobacco Comments   1/2-1 pack per day         Objective:    BP Readings from Last 3 Encounters:  05/23/22 120/80  02/20/22 138/80  01/18/22 132/70   Wt Readings from Last 3 Encounters:  05/23/22 249 lb 4 oz (113.1 kg)  02/20/22 248 lb 2 oz (112.5 kg)  01/18/22 247 lb 6 oz (112.2 kg)    BP 120/80   Pulse 74   Temp 97.6 F (36.4 C) (Temporal)   Wt 249 lb 4 oz (113.1 kg)   SpO2 94%   BMI 34.04 kg/m    Physical Exam Constitutional:      Appearance: Normal appearance. He is not ill-appearing or diaphoretic.  HENT:     Right Ear: External ear normal.     Left Ear: External ear normal.     Nose: Nose normal.  Eyes:      General: No scleral icterus.    Extraocular Movements: Extraocular movements intact.     Conjunctiva/sclera: Conjunctivae normal.  Cardiovascular:     Rate and Rhythm: Normal rate and regular rhythm.     Heart sounds: Murmur heard.  Pulmonary:     Effort: Pulmonary effort is normal. Prolonged expiration present. No tachypnea.     Breath sounds: Decreased air movement present. Decreased breath sounds and wheezing present.  Musculoskeletal:     Cervical back: Neck supple.  Skin:    General: Skin is warm and dry.  Neurological:     Mental Status: He is alert. Mental status is at baseline.  Psychiatric:        Mood and Affect: Mood normal.           Assessment & Plan:   Problem List Items Addressed This Visit       Cardiovascular and Mediastinum   Essential hypertension    Controlled. Cont losartan 50 mg      Relevant Medications   rivaroxaban (XARELTO) 20 MG TABS tablet   atorvastatin (LIPITOR) 10 MG tablet   losartan (COZAAR) 50 MG tablet   Other Relevant Orders   Comprehensive metabolic panel   CBC  Respiratory   COPD with acute exacerbation (HCC)    Exacerbation. Tight and poor air movement. Prednisone x 5 days. Will also work with pharmacy to look for lower cost inhalers. Cont advair and albuterol.       Relevant Medications   fluticasone-salmeterol (ADVAIR) 250-50 MCG/ACT AEPB   albuterol (VENTOLIN HFA) 108 (90 Base) MCG/ACT inhaler   predniSONE (DELTASONE) 20 MG tablet     Endocrine   DM (diabetes mellitus), type 2 with neurological complications ([place]) - Primary    Lab Results  Component Value Date   HGBA1C 7.0 (A) 05/23/2022  Adequate control. Cont metformin 1000 mg bid and glipizide 5 mg bid.       Relevant Medications   glucose blood test strip   metFORMIN (GLUCOPHAGE) 500 MG tablet   atorvastatin (LIPITOR) 10 MG tablet   glipiZIDE (GLUCOTROL) 5 MG tablet   losartan (COZAAR) 50 MG tablet   Other Relevant Orders   POCT glycosylated hemoglobin  (Hb A1C) (Completed)     Nervous and Auditory   Peripheral polyneuropathy    Poorly controlled, on maximum therapy. Focus on diabetes control. Cont lyrica 300 mg BID      Relevant Medications   pregabalin (LYRICA) 300 MG capsule   rOPINIRole (REQUIP) 0.5 MG tablet   traZODone (DESYREL) 50 MG tablet     Other   INSOMNIA    Persisting. Likely 2/2 to neuropathy but on max treatment. Trial of trazodone 50-100 mg nightly.       Relevant Medications   traZODone (DESYREL) 50 MG tablet   Chronic anticoagulation    Stable on xarelto 20 mg daily.       Relevant Medications   rivaroxaban (XARELTO) 20 MG TABS tablet   Dyslipidemia   Relevant Medications   atorvastatin (LIPITOR) 10 MG tablet   Other Relevant Orders   Lipid panel   Restless legs    Stable, continue requip 0.5mg       Relevant Medications   rOPINIRole (REQUIP) 0.5 MG tablet   Other Visit Diagnoses     COPD, mild (HCC)       Relevant Medications   fluticasone-salmeterol (ADVAIR) 250-50 MCG/ACT AEPB   albuterol (VENTOLIN HFA) 108 (90 Base) MCG/ACT inhaler   predniSONE (DELTASONE) 20 MG tablet   Other Relevant Orders   CBC   COPD, severe (HCC)       Relevant Medications   fluticasone-salmeterol (ADVAIR) 250-50 MCG/ACT AEPB   albuterol (VENTOLIN HFA) 108 (90 Base) MCG/ACT inhaler   predniSONE (DELTASONE) 20 MG tablet   Financial insecurity       Relevant Orders   AMB Referral to [facility]      Refer back to SW to look into medicaid/medicare  Return in about 3 months (around 08/22/2022) for for a new physican .  Lesleigh Noe, MD

## 2022-05-23 NOTE — Assessment & Plan Note (Signed)
Poorly controlled, on maximum therapy. Focus on diabetes control. Cont lyrica 300 mg BID

## 2022-05-23 NOTE — Assessment & Plan Note (Signed)
Stable on xarelto 20 mg daily.

## 2022-05-23 NOTE — Telephone Encounter (Signed)
Per PCP patient needs help with Spiriva cost. Obtained Spiriva copay card:  BIN: [id number] PCN: [employer] GRP: [id number] ID: [id number]  Sent Rx to pharmacy with card info.

## 2022-05-23 NOTE — Assessment & Plan Note (Signed)
Controlled. Cont losartan 50 mg 

## 2022-05-23 NOTE — Assessment & Plan Note (Signed)
Persisting. Likely 2/2 to neuropathy but on max treatment. Trial of trazodone 50-100 mg nightly.

## 2022-05-24 ENCOUNTER — Ambulatory Visit: Payer: Self-pay

## 2022-05-24 NOTE — Patient Instructions (Signed)
Visit Information  Thank you for taking time to visit with me today. Please don't hesitate to contact me if I can be of assistance to you.   Following are the goals we discussed today:   Goals Addressed             This Visit's Progress    COMPLETED: Care Coordination Activities       Care Coordination Interventions: Referral received requesting SW provide patient with assistance in applying for Medicare and/or Medicaid Performed chart review to note patient previously engaged with LCSW Eduard Clos while working to obtain Disability Outbound call placed to the patient to determine he has been approved for Disability and benefits were to begin this month but he has yet to receive Encouraged the patient to contact his lawyers office for assistance determining when payment would be received Patient reports he does have health insurance but it does not cover many of his medication costs Reviewed with the patient he will be eligible for Medicare Part A and B after he has received disability benefits for 24 months Discussed based on patients reported Disability income he is most likely over the income limit to receive Medicaid but he may apply to confirm - patient agreeable to completing a Medicaid application Determined the patient prefers to complete a paper application; patient has moved to [place] Mailed the patient a Medicaid application advising he submit to [place] located at [place] Encouraged patient to contact SW as needed         If you are experiencing a [place] or [place] or need someone to talk to, please call 1-800-273-TALK (toll free, 24 hour hotline)  Patient verbalizes understanding of instructions and care plan provided today and agrees to view in [place]. Active MyChart status and patient understanding of how to access instructions and care plan via MyChart confirmed with patient.     No further  follow up required: Please contact [place] for assistance with your Medicaid application. Their contact number is [place], BSW, CDP Social Worker, Certified Dementia Practitioner [place] [phone]

## 2022-05-24 NOTE — Patient Outreach (Signed)
  Care Coordination   Initial Visit Note   05/24/2022 Name: Dustin Koch MRN: [id number] DOB: 11-03-1960  Dustin Koch is a 61 y.o. year old male who sees Dustin Koch, Dustin Marker, MD for primary care. I spoke with  Dustin Koch by phone today.  What matters to the patients health and wellness today?  To obtain better health insurance    Goals Addressed             This Visit's Progress    COMPLETED: Care Coordination Activities       Care Coordination Interventions: Referral received requesting SW provide patient with assistance in applying for Medicare and/or Medicaid Performed chart review to note patient previously engaged with LCSW Dustin Koch while working to obtain Disability Outbound call placed to the patient to determine he has been approved for Disability and benefits were to begin this month but he has yet to receive Encouraged the patient to contact his lawyers office for assistance determining when payment would be received Patient reports he does have health insurance but it does not cover many of his medication costs Reviewed with the patient he will be eligible for Medicare Part A and B after he has received disability benefits for 24 months Discussed based on patients reported Disability income he is most likely over the income limit to receive Medicaid but he may apply to confirm - patient agreeable to completing a Medicaid application Determined the patient prefers to complete a paper application; patient has moved to [place] Mailed the patient a Medicaid application advising he submit to [facility] located at [place], [place] Encouraged patient to contact SW as needed         SDOH assessments and interventions completed:  Yes  SDOH Interventions Today    Flowsheet Row Most Recent Value  SDOH Interventions   Financial Strain Interventions Other (Comment)  [Referred to [facility] DSS]        Care Coordination Interventions  Activated:  Yes  Care Coordination Interventions:  Yes, provided   Follow up plan: No further intervention required.   Encounter Outcome:  Pt. Visit Completed   Daneen Schick, BSW, CDP Social Worker, Certified Dementia Practitioner [facility]  Care Coordination [phone]

## 2022-06-06 ENCOUNTER — Telehealth: Payer: Self-pay | Admitting: Family Medicine

## 2022-06-06 MED ORDER — TRAZODONE HCL 100 MG PO TABS: 100.0000 mg | ORAL_TABLET | Freq: Every day | ORAL | 0 refills | Status: AC

## 2022-06-06 NOTE — Telephone Encounter (Signed)
Reviewed results with patient, pt will need at refill of traZODone (DESYREL). Patient is currently taking 2 50mg  tablets which is what is helping with sleeping

## 2022-06-06 NOTE — Telephone Encounter (Signed)
Pt called returning a missed call from [facility], told pt about he results on lab work. Pt stated he spoke to Dr. Einar Pheasant about sleeping meds, traZODone (DESYREL) 50 MG tablet & stated if the current dosage of 50mg  required pt to take 2 tablets at a time just to work, she'd up the dosage up to 100 mg. Pt stated he does have to take (2) 50mg  tablets just to help with sleep. Call back # [phone].

## 2022-06-06 NOTE — Telephone Encounter (Signed)
Refill provided
